# Patient Record
Sex: Female | Born: 1970 | Race: Black or African American | Hispanic: No | Marital: Single | Smoking: Never smoker
Health system: Southern US, Community
[De-identification: ages and names within clinical notes are randomized; demographics above are authoritative.]

## PROBLEM LIST (undated history)

## (undated) DIAGNOSIS — I10 Essential (primary) hypertension: Secondary | ICD-10-CM

## (undated) HISTORY — DX: Essential (primary) hypertension: I10

## (undated) HISTORY — PX: TUBAL LIGATION: SHX77

---

## 1998-06-17 ENCOUNTER — Ambulatory Visit (HOSPITAL_COMMUNITY): Admission: RE | Admit: 1998-06-17 | Discharge: 1998-06-17 | Payer: Self-pay | Admitting: Obstetrics

## 1998-08-27 ENCOUNTER — Inpatient Hospital Stay (HOSPITAL_COMMUNITY): Admission: RE | Admit: 1998-08-27 | Discharge: 1998-08-27 | Payer: Self-pay | Admitting: Obstetrics

## 1998-08-31 ENCOUNTER — Encounter (HOSPITAL_COMMUNITY): Admission: RE | Admit: 1998-08-31 | Discharge: 1998-11-13 | Payer: Self-pay | Admitting: Obstetrics & Gynecology

## 1998-09-05 ENCOUNTER — Inpatient Hospital Stay (HOSPITAL_COMMUNITY): Admission: AD | Admit: 1998-09-05 | Discharge: 1998-09-05 | Payer: Self-pay | Admitting: *Deleted

## 1998-09-07 ENCOUNTER — Inpatient Hospital Stay (HOSPITAL_COMMUNITY): Admission: AD | Admit: 1998-09-07 | Discharge: 1998-09-07 | Payer: Self-pay | Admitting: *Deleted

## 1998-09-10 ENCOUNTER — Encounter: Admission: RE | Admit: 1998-09-10 | Discharge: 1998-09-10 | Payer: Self-pay | Admitting: Obstetrics

## 1998-09-16 ENCOUNTER — Encounter: Admission: RE | Admit: 1998-09-16 | Discharge: 1998-09-16 | Payer: Self-pay | Admitting: Obstetrics & Gynecology

## 1998-09-30 ENCOUNTER — Encounter: Admission: RE | Admit: 1998-09-30 | Discharge: 1998-09-30 | Payer: Self-pay | Admitting: Obstetrics & Gynecology

## 1998-10-14 ENCOUNTER — Encounter: Admission: RE | Admit: 1998-10-14 | Discharge: 1998-10-14 | Payer: Self-pay | Admitting: Obstetrics & Gynecology

## 1998-10-21 ENCOUNTER — Encounter: Admission: RE | Admit: 1998-10-21 | Discharge: 1998-10-21 | Payer: Self-pay | Admitting: Obstetrics & Gynecology

## 1998-10-28 ENCOUNTER — Encounter: Admission: RE | Admit: 1998-10-28 | Discharge: 1998-10-28 | Payer: Self-pay | Admitting: Obstetrics & Gynecology

## 1998-11-04 ENCOUNTER — Encounter: Admission: RE | Admit: 1998-11-04 | Discharge: 1998-11-04 | Payer: Self-pay | Admitting: Obstetrics & Gynecology

## 1998-11-11 ENCOUNTER — Inpatient Hospital Stay (HOSPITAL_COMMUNITY): Admission: AD | Admit: 1998-11-11 | Discharge: 1998-11-14 | Payer: Self-pay | Admitting: Obstetrics

## 1998-11-11 ENCOUNTER — Encounter: Admission: RE | Admit: 1998-11-11 | Discharge: 1998-11-11 | Payer: Self-pay | Admitting: Obstetrics & Gynecology

## 2000-04-06 ENCOUNTER — Encounter: Payer: Self-pay | Admitting: Emergency Medicine

## 2000-04-06 ENCOUNTER — Emergency Department (HOSPITAL_COMMUNITY): Admission: EM | Admit: 2000-04-06 | Discharge: 2000-04-06 | Payer: Self-pay | Admitting: Emergency Medicine

## 2003-01-30 ENCOUNTER — Emergency Department (HOSPITAL_COMMUNITY): Admission: EM | Admit: 2003-01-30 | Discharge: 2003-01-30 | Payer: Self-pay | Admitting: Emergency Medicine

## 2003-11-17 ENCOUNTER — Emergency Department (HOSPITAL_COMMUNITY): Admission: EM | Admit: 2003-11-17 | Discharge: 2003-11-17 | Payer: Self-pay | Admitting: Emergency Medicine

## 2004-01-25 ENCOUNTER — Emergency Department (HOSPITAL_COMMUNITY): Admission: EM | Admit: 2004-01-25 | Discharge: 2004-01-25 | Payer: Self-pay | Admitting: Emergency Medicine

## 2005-02-11 ENCOUNTER — Emergency Department (HOSPITAL_COMMUNITY): Admission: EM | Admit: 2005-02-11 | Discharge: 2005-02-11 | Payer: Self-pay | Admitting: Family Medicine

## 2005-02-11 ENCOUNTER — Ambulatory Visit (HOSPITAL_COMMUNITY): Admission: RE | Admit: 2005-02-11 | Discharge: 2005-02-11 | Payer: Self-pay | Admitting: Family Medicine

## 2007-02-13 ENCOUNTER — Emergency Department (HOSPITAL_COMMUNITY): Admission: EM | Admit: 2007-02-13 | Discharge: 2007-02-13 | Payer: Self-pay | Admitting: Family Medicine

## 2007-04-27 ENCOUNTER — Emergency Department (HOSPITAL_COMMUNITY): Admission: EM | Admit: 2007-04-27 | Discharge: 2007-04-27 | Payer: Self-pay | Admitting: Emergency Medicine

## 2007-05-02 ENCOUNTER — Emergency Department (HOSPITAL_COMMUNITY): Admission: EM | Admit: 2007-05-02 | Discharge: 2007-05-02 | Payer: Self-pay | Admitting: Emergency Medicine

## 2007-07-01 ENCOUNTER — Emergency Department (HOSPITAL_COMMUNITY): Admission: EM | Admit: 2007-07-01 | Discharge: 2007-07-01 | Payer: Self-pay | Admitting: Emergency Medicine

## 2007-07-04 ENCOUNTER — Emergency Department (HOSPITAL_COMMUNITY): Admission: EM | Admit: 2007-07-04 | Discharge: 2007-07-04 | Payer: Self-pay | Admitting: Family Medicine

## 2007-07-27 ENCOUNTER — Emergency Department (HOSPITAL_COMMUNITY): Admission: EM | Admit: 2007-07-27 | Discharge: 2007-07-27 | Payer: Self-pay | Admitting: Emergency Medicine

## 2007-11-09 ENCOUNTER — Emergency Department (HOSPITAL_COMMUNITY): Admission: EM | Admit: 2007-11-09 | Discharge: 2007-11-09 | Payer: Self-pay | Admitting: Emergency Medicine

## 2007-11-29 ENCOUNTER — Encounter: Admission: RE | Admit: 2007-11-29 | Discharge: 2007-11-29 | Payer: Self-pay | Admitting: Family Medicine

## 2007-12-30 ENCOUNTER — Emergency Department (HOSPITAL_COMMUNITY): Admission: EM | Admit: 2007-12-30 | Discharge: 2007-12-30 | Payer: Self-pay | Admitting: Emergency Medicine

## 2008-07-09 ENCOUNTER — Emergency Department (HOSPITAL_COMMUNITY): Admission: EM | Admit: 2008-07-09 | Discharge: 2008-07-10 | Payer: Self-pay | Admitting: Emergency Medicine

## 2009-01-25 ENCOUNTER — Emergency Department (HOSPITAL_COMMUNITY): Admission: EM | Admit: 2009-01-25 | Discharge: 2009-01-25 | Payer: Self-pay | Admitting: Family Medicine

## 2009-09-04 ENCOUNTER — Emergency Department (HOSPITAL_COMMUNITY): Admission: EM | Admit: 2009-09-04 | Discharge: 2009-09-04 | Payer: Self-pay | Admitting: Family Medicine

## 2009-11-24 ENCOUNTER — Emergency Department (HOSPITAL_COMMUNITY): Admission: EM | Admit: 2009-11-24 | Discharge: 2009-11-24 | Payer: Self-pay | Admitting: Emergency Medicine

## 2010-11-21 ENCOUNTER — Encounter: Payer: Self-pay | Admitting: Family Medicine

## 2011-01-19 ENCOUNTER — Emergency Department (HOSPITAL_COMMUNITY)
Admission: EM | Admit: 2011-01-19 | Discharge: 2011-01-19 | Disposition: A | Discharge status: Home / Self Care | Payer: Self-pay | Attending: Emergency Medicine | Admitting: Emergency Medicine

## 2011-01-19 DIAGNOSIS — J3489 Other specified disorders of nose and nasal sinuses: Secondary | ICD-10-CM | POA: Insufficient documentation

## 2011-01-19 DIAGNOSIS — J069 Acute upper respiratory infection, unspecified: Secondary | ICD-10-CM | POA: Insufficient documentation

## 2011-01-19 DIAGNOSIS — R0982 Postnasal drip: Secondary | ICD-10-CM | POA: Insufficient documentation

## 2011-01-19 DIAGNOSIS — M542 Cervicalgia: Secondary | ICD-10-CM | POA: Insufficient documentation

## 2011-01-19 DIAGNOSIS — J029 Acute pharyngitis, unspecified: Secondary | ICD-10-CM | POA: Insufficient documentation

## 2011-01-19 DIAGNOSIS — R05 Cough: Secondary | ICD-10-CM | POA: Insufficient documentation

## 2011-01-19 DIAGNOSIS — R059 Cough, unspecified: Secondary | ICD-10-CM | POA: Insufficient documentation

## 2011-01-19 LAB — RAPID STREP SCREEN (MED CTR MEBANE ONLY): Streptococcus, Group A Screen (Direct): NEGATIVE

## 2011-02-02 LAB — POCT URINALYSIS DIP (DEVICE)
Glucose, UA: NEGATIVE mg/dL
Nitrite: NEGATIVE
Specific Gravity, Urine: 1.025 (ref 1.005–1.030)
pH: 5.5 (ref 5.0–8.0)

## 2011-02-02 LAB — POCT I-STAT, CHEM 8
Glucose, Bld: 83 mg/dL (ref 70–99)
HCT: 44 % (ref 36.0–46.0)
Sodium: 138 mEq/L (ref 135–145)

## 2011-07-25 LAB — POCT CARDIAC MARKERS
CKMB, poc: <1 — ABNORMAL LOW
CKMB, poc: <1 — ABNORMAL LOW
Myoglobin, poc: 60.5
Operator id: 284141
Troponin i, poc: <0.05

## 2011-07-25 LAB — I-STAT 8, (EC8 V) (CONVERTED LAB)
Bicarbonate: 23.2
Glucose, Bld: 87
Hemoglobin: 14.6
Potassium: 4.3
Sodium: 138
pCO2, Ven: 38.7 — ABNORMAL LOW

## 2011-07-25 LAB — URINALYSIS, ROUTINE W REFLEX MICROSCOPIC
Bilirubin Urine: NEGATIVE
Glucose, UA: NEGATIVE
Hgb urine dipstick: NEGATIVE
Ketones, ur: NEGATIVE
Nitrite: NEGATIVE
Protein, ur: NEGATIVE
Specific Gravity, Urine: 1.012
Urobilinogen, UA: 0.2
pH: 7

## 2011-07-25 LAB — PREGNANCY, URINE: Preg Test, Ur: NEGATIVE

## 2011-07-25 LAB — D-DIMER, QUANTITATIVE: D-Dimer, Quant: <0.22

## 2011-07-25 LAB — POCT I-STAT CREATININE: Operator id: 284141

## 2011-08-03 LAB — URINALYSIS, ROUTINE W REFLEX MICROSCOPIC
Glucose, UA: NEGATIVE
Hgb urine dipstick: NEGATIVE
Ketones, ur: NEGATIVE
Protein, ur: NEGATIVE
Urobilinogen, UA: 1

## 2011-08-03 LAB — POCT PREGNANCY, URINE: Preg Test, Ur: NEGATIVE

## 2011-08-11 LAB — POCT URINALYSIS DIP (DEVICE)
Bilirubin Urine: NEGATIVE
Ketones, ur: NEGATIVE
Operator id: 116391
Protein, ur: NEGATIVE
Specific Gravity, Urine: 1.02

## 2011-08-11 LAB — I-STAT 8, (EC8 V) (CONVERTED LAB)
BUN: 10
Bicarbonate: 25 — ABNORMAL HIGH
HCT: 45
Hemoglobin: 15.3 — ABNORMAL HIGH
Operator id: 126491
TCO2: 26
pCO2, Ven: 47.2

## 2011-08-11 LAB — POCT PREGNANCY, URINE: Operator id: 116391

## 2011-08-16 LAB — CULTURE, ROUTINE-ABSCESS

## 2011-08-17 LAB — CULTURE, ROUTINE-ABSCESS

## 2011-12-29 ENCOUNTER — Emergency Department (HOSPITAL_COMMUNITY)
Admission: EM | Admit: 2011-12-29 | Discharge: 2011-12-30 | Discharge status: Against Medical Advice | Payer: Self-pay | Attending: Emergency Medicine | Admitting: Emergency Medicine

## 2011-12-29 ENCOUNTER — Encounter (HOSPITAL_COMMUNITY): Payer: Self-pay | Admitting: Emergency Medicine

## 2011-12-29 DIAGNOSIS — R109 Unspecified abdominal pain: Secondary | ICD-10-CM | POA: Insufficient documentation

## 2011-12-29 LAB — URINALYSIS, ROUTINE W REFLEX MICROSCOPIC
Glucose, UA: NEGATIVE mg/dL
Ketones, ur: NEGATIVE mg/dL
Protein, ur: NEGATIVE mg/dL
Urobilinogen, UA: 1 mg/dL (ref 0.0–1.0)

## 2011-12-29 LAB — URINE MICROSCOPIC-ADD ON

## 2011-12-29 NOTE — ED Notes (Signed)
Pt c/o mid to right abd pain onset 3 days ago, worse today.  Denies nausea or vomiting

## 2011-12-29 NOTE — ED Notes (Signed)
Called x's 2 in main lobby and triage w/ no answer

## 2012-03-16 ENCOUNTER — Encounter (HOSPITAL_COMMUNITY): Payer: Self-pay | Admitting: Emergency Medicine

## 2012-03-16 ENCOUNTER — Emergency Department (HOSPITAL_COMMUNITY)
Admission: EM | Admit: 2012-03-16 | Discharge: 2012-03-16 | Disposition: A | Discharge status: Home / Self Care | Payer: Self-pay | Attending: Emergency Medicine | Admitting: Emergency Medicine

## 2012-03-16 DIAGNOSIS — R109 Unspecified abdominal pain: Secondary | ICD-10-CM | POA: Insufficient documentation

## 2012-03-16 DIAGNOSIS — R110 Nausea: Secondary | ICD-10-CM | POA: Insufficient documentation

## 2012-03-16 DIAGNOSIS — Z79899 Other long term (current) drug therapy: Secondary | ICD-10-CM | POA: Insufficient documentation

## 2012-03-16 LAB — COMPREHENSIVE METABOLIC PANEL
ALT: 10 U/L (ref 0–35)
AST: 18 U/L (ref 0–37)
CO2: 25 mEq/L (ref 19–32)
Chloride: 106 mEq/L (ref 96–112)
GFR calc non Af Amer: >90 mL/min (ref >90)
Sodium: 140 mEq/L (ref 135–145)
Total Bilirubin: 0.2 mg/dL — ABNORMAL LOW (ref 0.3–1.2)

## 2012-03-16 LAB — URINALYSIS, ROUTINE W REFLEX MICROSCOPIC
Hgb urine dipstick: NEGATIVE
Nitrite: NEGATIVE
Specific Gravity, Urine: 1.023 (ref 1.005–1.030)
Urobilinogen, UA: 1 mg/dL (ref 0.0–1.0)

## 2012-03-16 LAB — DIFFERENTIAL
Basophils Absolute: 0.1 10*3/uL (ref 0.0–0.1)
Lymphocytes Relative: 36 % (ref 12–46)
Neutro Abs: 5 10*3/uL (ref 1.7–7.7)

## 2012-03-16 LAB — CBC
Platelets: 292 10*3/uL (ref 150–400)
RDW: 16.6 % — ABNORMAL HIGH (ref 11.5–15.5)
WBC: 9.1 10*3/uL (ref 4.0–10.5)

## 2012-03-16 LAB — POCT PREGNANCY, URINE: Preg Test, Ur: NEGATIVE

## 2012-03-16 MED ORDER — ONDANSETRON 4 MG PO TBDP
8.0000 mg | ORAL_TABLET | Freq: Once | ORAL | Status: AC
  Administered 2012-03-16: 8 mg via ORAL
  Filled 2012-03-16: qty 2

## 2012-03-16 MED ORDER — OXYCODONE-ACETAMINOPHEN 5-325 MG PO TABS
1.0000 | ORAL_TABLET | Freq: Once | ORAL | Status: AC
  Administered 2012-03-16: 1 via ORAL
  Filled 2012-03-16: qty 1

## 2012-03-16 MED ORDER — FAMOTIDINE 40 MG PO TABS: 20.0000 mg | ORAL_TABLET | Freq: Every day | ORAL | Status: DC

## 2012-03-16 NOTE — ED Notes (Signed)
Pt reports having in R groin abd pain for 1-2 weeks intermittently, has been taking ibuprofen and Excedrin for pain; had nausea last night, denies dysuria

## 2012-03-16 NOTE — ED Notes (Signed)
Patient states for the last couple of weeks she had RUQ pain that comes and goes.  Has been taking Advil and Excedrin for the pain but it has not been helping today.  States she does not have any appetite  Also states that smells sometimes make her nauseated but has not vomited.  Denies any changes in her bowels, no urinary problems or vaginal discharge

## 2012-03-16 NOTE — ED Provider Notes (Signed)
History     CSN: [id number]  Arrival date & time 03/16/12  Jenna Weaver   First MD Initiated Contact with Patient 03/16/12 2124      Chief Complaint  Patient presents with  . Abdominal Pain     Patient is a 41 y.o. female presenting with abdominal pain. The history is provided by the patient.  Abdominal Pain The primary symptoms of the illness include abdominal pain and nausea. The primary symptoms of the illness do not include fever, fatigue, vomiting, diarrhea, dysuria, vaginal discharge or vaginal bleeding. The current episode started more than 2 days ago. The onset of the illness was gradual. The problem has been gradually worsening.  The patient states that she believes she is currently not pregnant. Symptoms associated with the illness do not include chills, urgency, frequency or back pain.  Patient reports abdominal pain intermittently for past two weeks Her pain is mostly in the RUQ, but at times it is diffuse and in lower abdomen No vomiting No association with food No h/o abdominal surgery The pain with improve with ibuprofen  PMh - none  Past Surgical History  Procedure Date  . Tubal ligation     History reviewed. No pertinent family history.  History  Substance Use Topics  . Smoking status: Never Smoker   . Smokeless tobacco: Not on file  . Alcohol Use: No    OB History    Grav Para Term Preterm Abortions TAB SAB Ect Mult Living                  Review of Systems  Constitutional: Negative for fever, chills and fatigue.  Respiratory: Negative for cough.   Gastrointestinal: Positive for nausea and abdominal pain. Negative for vomiting and diarrhea.  Genitourinary: Negative for dysuria, urgency, frequency, vaginal bleeding and vaginal discharge.  Musculoskeletal: Negative for back pain.  All other systems reviewed and are negative.    Allergies  Codeine  Home Medications   Current Outpatient Rx  Name Route Sig Dispense Refill  . NAPROXEN SODIUM 220 MG  PO TABS Oral Take 220-440 mg by mouth See admin instructions. Take 220mg -440mg  up to 5 times daily as needed for pain      BP 149/71  Pulse 63  Temp(Src) 98.3 F (36.8 C) (Oral)  Resp 20  SpO2 98%  LMP 02/11/2012  Physical Exam CONSTITUTIONAL: Well developed/well nourished HEAD AND FACE: Normocephalic/atraumatic EYES: EOMI/PERRL, no icterus ENMT: Mucous membranes moist NECK: supple no meningeal signs SPINE:entire spine nontender CV: S1/S2 noted, no murmurs/rubs/gallops noted LUNGS: Lungs are clear to auscultation bilaterally, no apparent distress ABDOMEN: soft, mild tenderness to RUQ/epigastric region, no rebound/guarding is noted GU:no cva tenderness NEURO: Pt is awake/alert, moves all extremitiesx4 EXTREMITIES: pulses normal, full ROM SKIN: warm, color normal PSYCH: no abnormalities of mood noted  ED Course  Procedures   Labs Reviewed  URINALYSIS, ROUTINE W REFLEX MICROSCOPIC - Abnormal; Notable for the following:    APPearance HAZY (*)    All other components within normal limits  CBC - Abnormal; Notable for the following:    Hemoglobin 10.4 (*)    HCT 31.7 (*)    MCV 72.5 (*)    MCH 23.8 (*)    RDW 16.6 (*)    All other components within normal limits  POCT PREGNANCY, URINE  DIFFERENTIAL  COMPREHENSIVE METABOLIC PANEL  LIPASE, BLOOD  10:39 PM Pt well appearing pain had improved before my examination, only mild tenderness on exam No GU complaints Will check labs and  reassess  11:12 PM Pt improved while monitored in the ED She now reports that her pain is sometimes associated with food Suspect gastritis vs stable cholelithiasis She is well appearing/nontoxic and appropriate for outpatient management We discussed strict return precautions   The patient appears reasonably screened and/or stabilized for discharge and I doubt any other medical condition or other [facility] requiring further screening, evaluation, or treatment in the ED at this time prior to  discharge.    MDM  Nursing notes reviewed and considered in documentation All labs/vitals reviewed and considered         Joya Gaskins, MD 03/16/12 2314

## 2012-03-16 NOTE — Discharge Instructions (Signed)

## 2012-06-09 ENCOUNTER — Encounter (HOSPITAL_COMMUNITY): Payer: Self-pay | Admitting: *Deleted

## 2012-06-09 ENCOUNTER — Emergency Department (HOSPITAL_COMMUNITY)
Admission: EM | Admit: 2012-06-09 | Discharge: 2012-06-09 | Disposition: A | Discharge status: Home / Self Care | Payer: Self-pay | Attending: Emergency Medicine | Admitting: Emergency Medicine

## 2012-06-09 DIAGNOSIS — M79609 Pain in unspecified limb: Secondary | ICD-10-CM | POA: Insufficient documentation

## 2012-06-09 DIAGNOSIS — M79605 Pain in left leg: Secondary | ICD-10-CM

## 2012-06-09 LAB — BASIC METABOLIC PANEL
CO2: 26 mEq/L (ref 19–32)
Calcium: 8.6 mg/dL (ref 8.4–10.5)
Creatinine, Ser: 0.63 mg/dL (ref 0.50–1.10)
Glucose, Bld: 87 mg/dL (ref 70–99)
Sodium: 139 mEq/L (ref 135–145)

## 2012-06-09 NOTE — ED Provider Notes (Signed)
History    This chart was scribed for Gerhard Munch, MD, MD by Smitty Pluck. The patient was seen in room [facility] and the patient's care was started at 12:51PM.   CSN: [id number]  Arrival date & time 06/09/12  1217   First MD Initiated Contact with Patient 06/09/12 1226      Chief Complaint  Patient presents with  . Leg Pain    (Consider location/radiation/quality/duration/timing/severity/associated sxs/prior treatment) Patient is a 41 y.o. female presenting with leg pain. The history is provided by the patient.  Leg Pain    Jenna Weaver is a 41 y.o. female who presents to the Emergency Department complaining of moderate left leg swelling and mild pain onset 1 day ago. Pt reports the entire left leg has swelling. Pt reports trouble walking due leg swelling. Denies new medication, trauma to left leg and new diet. Denies smoking and drinking. Denies any other pain.   History reviewed. No pertinent past medical history.  Past Surgical History  Procedure Date  . Tubal ligation     History reviewed. No pertinent family history. Family hx of diabetes and HTN.   History  Substance Use Topics  . Smoking status: Never Smoker   . Smokeless tobacco: Not on file  . Alcohol Use: No    OB History    Grav Para Term Preterm Abortions TAB SAB Ect Mult Living                  Review of Systems  Constitutional:       HPI  HENT:       HPI otherwise negative  Eyes: Negative.   Respiratory:       HPI, otherwise negative  Cardiovascular:       HPI, otherwise nmegative  Gastrointestinal: Negative for vomiting.  Genitourinary:       HPI, otherwise negative  Musculoskeletal: Positive for gait problem.       HPI, otherwise negative  Skin: Negative.   Neurological: Negative for syncope.    Allergies  Codeine  Home Medications   Current Outpatient Rx  Name Route Sig Dispense Refill  . FAMOTIDINE 40 MG PO TABS Oral Take 0.5 tablets (20 mg total) by mouth daily. 30 tablet  0  . NAPROXEN SODIUM 220 MG PO TABS Oral Take 220-440 mg by mouth See admin instructions. Take 220mg -440mg  up to 5 times daily as needed for pain      BP 152/78  Pulse 74  Temp 98.4 F (36.9 C) (Oral)  Resp 18  SpO2 98%  Physical Exam  Nursing note and vitals reviewed. Constitutional: She is oriented to person, place, and time. She appears well-developed and well-nourished.  HENT:  Head: Normocephalic and atraumatic.  Cardiovascular: Normal rate, regular rhythm and normal heart sounds.   Pulmonary/Chest: Effort normal. No respiratory distress.  Musculoskeletal:       Good distal pulses  Dimensions are the same bilaterally  No posterior findings    Neurological: She is alert and oriented to person, place, and time.  Skin: Skin is warm and dry.  Psychiatric: She has a normal mood and affect. Her behavior is normal.    ED Course  Procedures (including critical care time)   COORDINATION OF CARE: 12:56PM EDP discusses pt ED treatment with pt    Labs Reviewed  D-DIMER, QUANTITATIVE  BASIC METABOLIC PANEL   No results found.   No diagnosis found.    MDM  I personally performed the services described in this documentation, which  was scribed in my presence. The recorded information has been reviewed and considered.   This generally well-appearing female presents with concerns of the left leg pain and edema.  On exam the patient is in no distress, with no hypoxia, tachycardia, tachypnea.  The patient's legs are equal dimensions, is measured with tape.  There is mild tenderness to palpation about the left anterior mid thigh, suggestive of a musculoskeletal etiology.  Risk factors, the patient will risk for DVT.  A negative d-dimer reinforces this.  The patient's chemistry was performed to evaluate for a left leg disturbances, this was not demonstrated.  She was discharged in stable condition with return precautions, follow up instructions   Gerhard Munch, MD 06/09/12  [phone]

## 2012-06-09 NOTE — ED Notes (Signed)
Pt c/o left leg pain with some swelling x 3 days; pt denies obvious injury

## 2012-09-14 ENCOUNTER — Emergency Department (HOSPITAL_COMMUNITY)
Admission: EM | Admit: 2012-09-14 | Discharge: 2012-09-14 | Disposition: A | Discharge status: Home / Self Care | Payer: Self-pay | Attending: Emergency Medicine | Admitting: Emergency Medicine

## 2012-09-14 ENCOUNTER — Encounter (HOSPITAL_COMMUNITY): Payer: Self-pay | Admitting: *Deleted

## 2012-09-14 DIAGNOSIS — R1310 Dysphagia, unspecified: Secondary | ICD-10-CM | POA: Insufficient documentation

## 2012-09-14 DIAGNOSIS — J029 Acute pharyngitis, unspecified: Secondary | ICD-10-CM | POA: Insufficient documentation

## 2012-09-14 MED ORDER — PENICILLIN G BENZATHINE 1200000 UNIT/2ML IM SUSP
1.2000 10*6.[IU] | Freq: Once | INTRAMUSCULAR | Status: AC
  Administered 2012-09-14: 1.2 10*6.[IU] via INTRAMUSCULAR
  Filled 2012-09-14: qty 2

## 2012-09-14 MED ORDER — HYDROCODONE-ACETAMINOPHEN 7.5-325 MG/15ML PO SOLN: 15.0000 mL | Freq: Four times a day (QID) | ORAL | Status: DC | PRN

## 2012-09-14 NOTE — ED Notes (Signed)
Pt c/o a sore throat since yesterday no temp.  Difficulty swallowing

## 2012-09-14 NOTE — ED Provider Notes (Signed)
History   This chart was scribed for [employer]. Rubin Payor, MD, by Marcina Millard scribe. The patient was seen in room TR07C/TR07C and the patient's care was started at 1757.    CSN: [id number]  Arrival date & time 09/14/12  1749   First MD Initiated Contact with Patient 09/14/12 1757      Chief Complaint  Patient presents with  . Sore Throat    (Consider location/radiation/quality/duration/timing/severity/associated sxs/prior treatment) HPI Comments: Jenna Weaver is a 41 y.o. female who presents to the Emergency Department complaining of a constant, moderate sore throat that began yesterday. She reports associated pain and difficulty swallowing. She denies any fever. She denies any recent choking or injury to the area. She is allergic to codeine and is currently not pregnant.     History reviewed. No pertinent past medical history.  Past Surgical History  Procedure Date  . Tubal ligation     No family history on file.  History  Substance Use Topics  . Smoking status: Never Smoker   . Smokeless tobacco: Not on file  . Alcohol Use: No    OB History    Grav Para Term Preterm Abortions TAB SAB Ect Mult Living                  Review of Systems  Constitutional: Negative for fever.  HENT: Positive for sore throat and trouble swallowing.   All other systems reviewed and are negative.    Allergies  Codeine  Home Medications   Current Outpatient Rx  Name  Route  Sig  Dispense  Refill  . OVER THE COUNTER MEDICATION   Oral   Take 1 tablet by mouth every 8 (eight) hours as needed. OTC Cold tablets for cold symptoms         . HYDROCODONE-ACETAMINOPHEN 7.5-325 MG/15ML PO SOLN   Oral   Take 15 mLs by mouth every 6 (six) hours as needed for pain.   120 mL   0     BP 141/77  Pulse 76  Temp 98.2 F (36.8 C) (Oral)  Resp 16  SpO2 99%  Physical Exam  Nursing note and vitals reviewed. Constitutional: She is oriented to person, place, and time. She  appears well-developed and well-nourished. No distress.  HENT:  Head: Normocephalic and atraumatic.  Mouth/Throat: Oropharyngeal exudate and posterior oropharyngeal erythema present.       She has no sinus tenderness. She has erythema and bilateral exudates. She has no swelling or meningeal signs.  Eyes: EOM are normal. Pupils are equal, round, and reactive to light.  Neck: Normal range of motion. Neck supple. No tracheal deviation present.  Cardiovascular: Normal rate.   Pulmonary/Chest: Effort normal. No respiratory distress.  Abdominal: Soft. She exhibits no distension.  Musculoskeletal: Normal range of motion. She exhibits no edema.  Lymphadenopathy:    She has cervical adenopathy.  Neurological: She is alert and oriented to person, place, and time.  Skin: Skin is warm and dry.  Psychiatric: She has a normal mood and affect. Her behavior is normal.    ED Course  Procedures (including critical care time)  COORDINATION OF CARE:  18:02- Discussed planned course of treatment with the patient, including antibiotics,  who is agreeable at this time.    Labs Reviewed  RAPID STREP SCREEN   No results found.   1. Pharyngitis       MDM  Patient with sore throat. Has exudate. Will be treated with antibiotics and pain medicines. Patient was  given penicillin IM since she states she cannot take pills I personally performed the services described in this documentation, which was scribed in my presence. The recorded information has been reviewed and is accurate.          Juliet Rude. Rubin Payor, MD 09/14/12 2014

## 2012-09-14 NOTE — ED Notes (Signed)
Patient is alert and orientedx4.  Patient was explained discharge instructions and she had no questions.   

## 2012-09-19 ENCOUNTER — Emergency Department (HOSPITAL_COMMUNITY)
Admission: EM | Admit: 2012-09-19 | Discharge: 2012-09-19 | Disposition: A | Discharge status: Home / Self Care | Payer: Self-pay | Attending: Emergency Medicine | Admitting: Emergency Medicine

## 2012-09-19 ENCOUNTER — Encounter (HOSPITAL_COMMUNITY): Payer: Self-pay

## 2012-09-19 DIAGNOSIS — H5789 Other specified disorders of eye and adnexa: Secondary | ICD-10-CM | POA: Insufficient documentation

## 2012-09-19 DIAGNOSIS — H109 Unspecified conjunctivitis: Secondary | ICD-10-CM | POA: Insufficient documentation

## 2012-09-19 MED ORDER — GENTAMICIN SULFATE 0.3 % OP SOLN: 1.0000 [drp] | OPHTHALMIC | Status: DC

## 2012-09-19 NOTE — ED Provider Notes (Signed)
History     CSN: [id number]  Arrival date & time 09/19/12  4098   First MD Initiated Contact with Patient 09/19/12 0930      Chief Complaint  Patient presents with  . Conjunctivitis    Right eye is itching and draining.     (Consider location/radiation/quality/duration/timing/severity/associated sxs/prior treatment) HPI  Jenna Weaver is a 41 y.o. female complaining of left eye tearing and redness onset x24 hours ago. Pt denies change in vision, sick contacts, fever .  No past medical history on file.  Past Surgical History  Procedure Date  . Tubal ligation     No family history on file.  History  Substance Use Topics  . Smoking status: Never Smoker   . Smokeless tobacco: Not on file  . Alcohol Use: No    OB History    Grav Para Term Preterm Abortions TAB SAB Ect Mult Living                  Review of Systems  Constitutional: Negative for fever.  Eyes: Positive for discharge and redness. Negative for visual disturbance.  Respiratory: Negative for shortness of breath.   Cardiovascular: Negative for chest pain.  Gastrointestinal: Negative for nausea, vomiting, abdominal pain and diarrhea.  All other systems reviewed and are negative.    Allergies  Codeine  Home Medications   Current Outpatient Rx  Name  Route  Sig  Dispense  Refill  . HYDROCODONE-ACETAMINOPHEN 7.5-325 MG/15ML PO SOLN   Oral   Take 15 mLs by mouth every 6 (six) hours as needed for pain.   120 mL   0     BP 140/85  Pulse 85  Temp 97.6 F (36.4 C)  Resp 18  Ht 5' (1.524 m)  Wt 180 lb (81.647 kg)  BMI 35.15 kg/m2  SpO2 99%  Physical Exam  Nursing note and vitals reviewed. Constitutional: She is oriented to person, place, and time. She appears well-developed and well-nourished. No distress.  HENT:  Head: Normocephalic.  Eyes: EOM are normal. Pupils are equal, round, and reactive to light. Right eye exhibits no discharge and no exudate. Left eye exhibits no discharge and  no exudate. Right conjunctiva is injected. Left conjunctiva is injected. Right eye exhibits normal extraocular motion. Left eye exhibits normal extraocular motion.  Cardiovascular: Normal rate.   Pulmonary/Chest: Effort normal. No stridor.  Musculoskeletal: Normal range of motion.  Neurological: She is alert and oriented to person, place, and time.  Psychiatric: She has a normal mood and affect.    ED Course  Procedures (including critical care time)  Labs Reviewed - No data to display No results found.   1. Conjunctivitis       MDM    Pt verbalized understanding and agrees with care plan. Outpatient follow-up and return precautions given.    New Prescriptions   GENTAMICIN (GARAMYCIN) 0.3 % OPHTHALMIC SOLUTION    Place 1 drop into both eyes every 4 (four) hours.         Wynetta Emery, PA-C 09/20/12 1053

## 2012-09-26 NOTE — ED Provider Notes (Signed)
Medical screening examination/treatment/procedure(s) were performed by non-physician practitioner and as supervising physician I was immediately available for consultation/collaboration.   Carleene Cooper III, MD 09/26/12 2037

## 2012-11-14 ENCOUNTER — Encounter (HOSPITAL_COMMUNITY): Payer: Self-pay | Admitting: Family Medicine

## 2012-11-14 ENCOUNTER — Emergency Department (HOSPITAL_COMMUNITY): Payer: Self-pay

## 2012-11-14 ENCOUNTER — Emergency Department (HOSPITAL_COMMUNITY)
Admission: EM | Admit: 2012-11-14 | Discharge: 2012-11-14 | Disposition: A | Discharge status: Home / Self Care | Payer: Self-pay | Attending: Emergency Medicine | Admitting: Emergency Medicine

## 2012-11-14 DIAGNOSIS — R109 Unspecified abdominal pain: Secondary | ICD-10-CM | POA: Insufficient documentation

## 2012-11-14 LAB — COMPREHENSIVE METABOLIC PANEL
ALT: 9 U/L (ref 0–35)
AST: 15 U/L (ref 0–37)
Alkaline Phosphatase: 64 U/L (ref 39–117)
Calcium: 9 mg/dL (ref 8.4–10.5)
Glucose, Bld: 89 mg/dL (ref 70–99)
Potassium: 3.8 mEq/L (ref 3.5–5.1)
Sodium: 139 mEq/L (ref 135–145)
Total Protein: 7.4 g/dL (ref 6.0–8.3)

## 2012-11-14 LAB — URINALYSIS, ROUTINE W REFLEX MICROSCOPIC
Glucose, UA: NEGATIVE mg/dL
Ketones, ur: NEGATIVE mg/dL
Leukocytes, UA: NEGATIVE
Specific Gravity, Urine: 1.007 (ref 1.005–1.030)
pH: 6 (ref 5.0–8.0)

## 2012-11-14 LAB — CBC WITH DIFFERENTIAL/PLATELET
Basophils Absolute: 0.1 10*3/uL (ref 0.0–0.1)
Eosinophils Absolute: 0.1 10*3/uL (ref 0.0–0.7)
Eosinophils Relative: 1 % (ref 0–5)
Lymphocytes Relative: 25 % (ref 12–46)
Lymphs Abs: 2.1 10*3/uL (ref 0.7–4.0)
MCH: 22.6 pg — ABNORMAL LOW (ref 26.0–34.0)
MCV: 71.9 fL — ABNORMAL LOW (ref 78.0–100.0)
Neutrophils Relative %: 64 % (ref 43–77)
Platelets: 373 10*3/uL (ref 150–400)
RBC: 3.67 MIL/uL — ABNORMAL LOW (ref 3.87–5.11)
RDW: 17.2 % — ABNORMAL HIGH (ref 11.5–15.5)
WBC: 8.1 10*3/uL (ref 4.0–10.5)

## 2012-11-14 MED ORDER — IBUPROFEN 800 MG PO TABS
800.0000 mg | ORAL_TABLET | Freq: Once | ORAL | Status: AC
  Administered 2012-11-14: 800 mg via ORAL
  Filled 2012-11-14: qty 1

## 2012-11-14 MED ORDER — TAMSULOSIN HCL 0.4 MG PO CAPS: 0.4000 mg | ORAL_CAPSULE | Freq: Every day | ORAL | Status: DC

## 2012-11-14 MED ORDER — ONDANSETRON HCL 4 MG PO TABS: 4.0000 mg | ORAL_TABLET | Freq: Four times a day (QID) | ORAL | Status: DC

## 2012-11-14 MED ORDER — ACETAMINOPHEN 325 MG PO TABS
975.0000 mg | ORAL_TABLET | Freq: Once | ORAL | Status: AC
  Administered 2012-11-14: 975 mg via ORAL
  Filled 2012-11-14: qty 3

## 2012-11-14 MED ORDER — OXYCODONE-ACETAMINOPHEN 10-325 MG PO TABS: 1.0000 | ORAL_TABLET | ORAL | Status: DC | PRN

## 2012-11-14 NOTE — ED Notes (Signed)
Patient transported to CT 

## 2012-11-14 NOTE — ED Provider Notes (Signed)
History     CSN: [id number]  Arrival date & time 11/14/12  4098   First MD Initiated Contact with Patient 11/14/12 [phone]      Chief Complaint  Patient presents with  . Flank Pain    (Consider location/radiation/quality/duration/timing/severity/associated sxs/prior treatment) HPI  Jenna Weaver is a 42 y.o. female complaining of  1-2 weeks of right flank pain. Denies history of kidney stones, dysuria, fever, recent trauma, vaginal discharge.. Patient several episodes of vomiting last week it hours all she is taking by mouth is normally. She states the pain is controlled with Zantac and ibuprofen. It is rated at 7/10 at worst.   History reviewed. No pertinent past medical history.  Past Surgical History  Procedure Date  . Tubal ligation     History reviewed. No pertinent family history.  History  Substance Use Topics  . Smoking status: Never Smoker   . Smokeless tobacco: Not on file  . Alcohol Use: No    OB History    Grav Para Term Preterm Abortions TAB SAB Ect Mult Living                  Review of Systems  Constitutional: Negative for fever.  Respiratory: Negative for shortness of breath.   Cardiovascular: Negative for chest pain.  Gastrointestinal: Negative for nausea, vomiting, abdominal pain and diarrhea.  Genitourinary: Positive for flank pain. Negative for dysuria.  All other systems reviewed and are negative.    Allergies  Codeine  Home Medications   Current Outpatient Rx  Name  Route  Sig  Dispense  Refill  . GENTAMICIN SULFATE 0.3 % OP SOLN   Both Eyes   Place 1 drop into both eyes every 4 (four) hours.   5 mL   0   . HYDROCODONE-ACETAMINOPHEN 7.5-325 MG/15ML PO SOLN   Oral   Take 15 mLs by mouth every 6 (six) hours as needed for pain.   120 mL   0     BP 119/59  Pulse 73  Temp 97.3 F (36.3 C)  Resp 18  SpO2 97%  LMP 11/09/2012  Physical Exam  Nursing note and vitals reviewed. Constitutional: She is oriented to person,  place, and time. She appears well-developed and well-nourished. No distress.  HENT:  Head: Normocephalic and atraumatic.  Mouth/Throat: Oropharynx is clear and moist.  Eyes: Conjunctivae normal and EOM are normal. Pupils are equal, round, and reactive to light.  Neck: Normal range of motion.  Cardiovascular: Normal rate, regular rhythm and intact distal pulses.   Pulmonary/Chest: Effort normal and breath sounds normal. No stridor. No respiratory distress. She has no wheezes. She has no rales. She exhibits no tenderness.  Abdominal: Soft. Bowel sounds are normal. She exhibits no distension and no mass. There is no tenderness. There is no rebound and no guarding.  Genitourinary:       No CVA tenderness bilaterally.  Musculoskeletal: Normal range of motion.  Neurological: She is alert and oriented to person, place, and time.  Psychiatric: She has a normal mood and affect.    ED Course  Procedures (including critical care time)  Labs Reviewed  CBC WITH DIFFERENTIAL - Abnormal; Notable for the following:    RBC 3.67 (*)     Hemoglobin 8.3 (*)     HCT 26.4 (*)     MCV 71.9 (*)     MCH 22.6 (*)     RDW 17.2 (*)     All other components within normal limits  COMPREHENSIVE METABOLIC PANEL - Abnormal; Notable for the following:    Albumin 3.3 (*)     Total Bilirubin 0.2 (*)     All other components within normal limits  URINALYSIS, ROUTINE W REFLEX MICROSCOPIC  POCT PREGNANCY, URINE   Ct Abdomen Pelvis Wo Contrast  11/14/2012  *RADIOLOGY REPORT*  Clinical Data: Flank pain  CT ABDOMEN AND PELVIS WITHOUT CONTRAST  Technique:  Multidetector CT imaging of the abdomen and pelvis was performed following the standard protocol without intravenous contrast.  Comparison: 07/09/2008  Findings: Lung bases: The lung bases appear clear.No pericardial or pleural effusion.  Kidneys/Ureters/Bladder:   The adrenal glands are both unremarkable.  The right kidney is normal.  No right hydronephrosis or  hydroureter.  Normal appearance of the left kidney.  Low attenuation structure within the mid pole of the left kidney measures 11 mm, image 29.  No left-sided hydronephrosis or hydroureter.  No nephrolithiasis.  The urinary bladder appears normal.  Enlarged fibroid uterus is partially calcified.  This measures 9.5 x 9.4 by 10.0 cm.  Other:   Focal area of low attenuation within the anterior right hepatic lobe measures 9 mm.  Previously this measured 6 mm.  No additional focal liver abnormalities.  The gallbladder is normal. No biliary dilatation.  Normal appearance of the pancreas.  The spleen is normal.  The abdominal aorta has a normal caliber.  No aneurysm.  There are no enlarged lymph nodes identified within the upper abdomen or the pelvis.  No inguinal adenopathy.  No free fluid or abnormal fluid collection identified.  The stomach appears normal.  The small bowel loops are unremarkable.  The appendix is visualized and appears normal. Normal appearance of the colon.  Bones/Musculoskeletal: No abnormality identified.  IMPRESSION:  1.  No evidence for nephrolithiasis or obstructive uropathy. 2.  11 mm hypodensity within the left kidney is incompletely characterized without IV contrast material. 3.  9 mm hypodensity in the anterior right hepatic lobe is too small to reliably characterize but has increased from 6 mm (07/09/2008). 4.  Partially calcified fibroid uterus.   Original Report Authenticated By: Signa Kell, M.D.    US Renal  11/14/2012  *RADIOLOGY REPORT*  Clinical Data: Flank pain  RENAL/URINARY TRACT ULTRASOUND COMPLETE  Comparison:  Prior CT scan of the abdomen 07/09/2008  Findings:  Right Kidney:  Within normal limits for size at 11.7 cm.  Normal parenchymal echogenicity and renal contour.  There is a small 11 x 8 by 8 mm minimally complex anechoic lesion with a single thin internal septation in the interpolar kidney most consistent with a minimally complex renal cyst.  There is mild  hydronephrosis.  Left Kidney:  Within normal limits for size (11.9 cm), contour and parenchymal echogenicity.  No hydronephrosis.  No focal solid lesion.  Bladder:  The bladder is distended with urine.  A left ureteral jet is identified.  IMPRESSION:  1.  Mild right hydronephrosis.  No right ureteral jet is identified suggesting at least partial obstruction.  2.  Minimally complex right renal cyst.   Original Report Authenticated By: Malachy Moan, M.D.      1. Flank pain       MDM  Pain is well-controlled with Motrin and acetaminophen.  Renal US shows right hydro, right renal cyst. CT shows no hydronephrosis or stone. Advised patient to try to establish primary care with follow back with the ED as needed for worsening or concerning symptoms.  New Prescriptions   OXYCODONE-ACETAMINOPHEN (PERCOCET) 10-325 MG  PER TABLET    Take 1 tablet by mouth every 4 (four) hours as needed for pain.         Wynetta Emery, PA-C 11/14/12 1626

## 2012-11-14 NOTE — ED Notes (Signed)
Per pt 2 weeks of right sided flank pain radiating into abdomen. Pain is intermittent. sts she vomited last week. Denies urinary symptoms. Denies vaginal symptoms.

## 2012-11-15 NOTE — ED Provider Notes (Signed)
Medical screening examination/treatment/procedure(s) were performed by non-physician practitioner and as supervising physician I was immediately available for consultation/collaboration.   Suzi Roots, MD 11/15/12 [phone]

## 2012-12-27 ENCOUNTER — Encounter (HOSPITAL_COMMUNITY): Payer: Self-pay | Admitting: Emergency Medicine

## 2012-12-27 ENCOUNTER — Emergency Department (HOSPITAL_COMMUNITY)
Admission: EM | Admit: 2012-12-27 | Discharge: 2012-12-27 | Disposition: A | Discharge status: Home / Self Care | Payer: Self-pay | Attending: Emergency Medicine | Admitting: Emergency Medicine

## 2012-12-27 DIAGNOSIS — L299 Pruritus, unspecified: Secondary | ICD-10-CM | POA: Insufficient documentation

## 2012-12-27 MED ORDER — DEXAMETHASONE SODIUM PHOSPHATE 10 MG/ML IJ SOLN
10.0000 mg | Freq: Once | INTRAMUSCULAR | Status: AC
  Administered 2012-12-27: 10 mg via INTRAMUSCULAR
  Filled 2012-12-27: qty 1

## 2012-12-27 MED ORDER — CARRINGTON MOISTURE BARRIER EX CREA: TOPICAL_CREAM | CUTANEOUS | Status: DC | PRN

## 2012-12-27 MED ORDER — ACYCLOVIR 400 MG PO TABS: 800.0000 mg | ORAL_TABLET | Freq: Every day | ORAL | Status: DC

## 2012-12-27 NOTE — ED Provider Notes (Signed)
History     CSN: [id number]  Arrival date & time 12/27/12  1316   First MD Initiated Contact with Patient 12/27/12 1334      No chief complaint on file.   (Consider location/radiation/quality/duration/timing/severity/associated sxs/prior treatment) HPI Comments: Cyst 42 year old female, no past medical history, who presents emergency department with chief complaint of. [place]. Patient states that she has been itching since last week. She states that she itches everywhere. She states that sometimes she can see small red bumps, but are not visible now. She denies any new soaps or detergents. She denies fever, any open wounds, or other symptoms. Additionally, the patient reports burning over the posterior aspect of the left arm, however there is no redness, or rash. Patient has past medical history her markable for chickenpox. She is tried taking Benadryl with no relief.  The history is provided by the patient. No language interpreter was used.    History reviewed. No pertinent past medical history.  Past Surgical History  Procedure Laterality Date  . Tubal ligation      No family history on file.  History  Substance Use Topics  . Smoking status: Never Smoker   . Smokeless tobacco: Not on file  . Alcohol Use: No    OB History   Grav Para Term Preterm Abortions TAB SAB Ect Mult Living                  Review of Systems  All other systems reviewed and are negative.    Allergies  Codeine  Home Medications   Current Outpatient Rx  Name  Route  Sig  Dispense  Refill  . diphenhydrAMINE (BENADRYL) 25 MG tablet   Oral   Take 50 mg by mouth every 6 (six) hours as needed for itching.         Marland Kitchen acyclovir (ZOVIRAX) 400 MG tablet   Oral   Take 2 tablets (800 mg total) by mouth 5 (five) times daily.   50 tablet   0   . Skin Protectants, Misc. (EUCERIN) cream   Topical   Apply topically as needed for wound care.   397 g   0     BP 132/79  Pulse 80  Temp(Src) 98 F  (36.7 C) (Oral)  Resp 16  SpO2 100%  Physical Exam  Nursing note and vitals reviewed. Constitutional: She is oriented to person, place, and time. She appears well-developed and well-nourished.  HENT:  Head: Normocephalic and atraumatic.  Eyes: Conjunctivae and EOM are normal. Pupils are equal, round, and reactive to light.  Neck: Normal range of motion. Neck supple.  Cardiovascular: Normal rate and regular rhythm.  Exam reveals no gallop and no friction rub.   No murmur heard. Pulmonary/Chest: Effort normal and breath sounds normal. No respiratory distress. She has no wheezes. She has no rales. She exhibits no tenderness.  Abdominal: Soft. Bowel sounds are normal. She exhibits no distension and no mass. There is no tenderness. There is no rebound and no guarding.  Musculoskeletal: Normal range of motion. She exhibits no edema and no tenderness.  Neurological: She is alert and oriented to person, place, and time.  Skin: Skin is warm and dry.  No rashes, lesions, or redness noted, the skin does appear to be diffusely dry  Psychiatric: She has a normal mood and affect. Her behavior is normal. Judgment and thought content normal.    ED Course  Procedures (including critical care time)  Labs Reviewed - No data to display  No results found.   1. Itching       MDM  42 year old female with periods. I suspect the patient's symptoms are stemming from dry skin. Her skin appears dry on exam. She does say that she uses cocoa butter lotion. Additionally she complains of some burning on the posterior side of the left arm, which is distributed in a single dermatome. I do not see any evidence of a rash or lesions, so I doubt that this is a shingles infection. However, I will give the patient a prescription for acyclovir, which I told her to only fill and take if she noticed a rash developing in the area where she is burning. Otherwise I recommended a new lotion for her to use today with the dry  skin, and will recommend dermatologic followup. Will treat the patient with a shot of Decadron in the emergency department to help alleviate the itching. She is tried taking Benadryl with no relief.       Roxy Horseman, PA-C 12/27/12 1413

## 2012-12-27 NOTE — ED Notes (Signed)
Itching everywhere since last week  And back of left arm hurting  No injury  Since last week also

## 2012-12-28 NOTE — ED Provider Notes (Signed)
Medical screening examination/treatment/procedure(s) were performed by non-physician practitioner and as supervising physician I was immediately available for consultation/collaboration.    Vida Roller, MD 12/28/12 [phone]

## 2013-03-23 ENCOUNTER — Emergency Department (HOSPITAL_COMMUNITY)
Admission: EM | Admit: 2013-03-23 | Discharge: 2013-03-23 | Disposition: A | Discharge status: Home / Self Care | Payer: Self-pay | Attending: Emergency Medicine | Admitting: Emergency Medicine

## 2013-03-23 ENCOUNTER — Encounter (HOSPITAL_COMMUNITY): Payer: Self-pay | Admitting: *Deleted

## 2013-03-23 DIAGNOSIS — N760 Acute vaginitis: Secondary | ICD-10-CM | POA: Insufficient documentation

## 2013-03-23 DIAGNOSIS — R300 Dysuria: Secondary | ICD-10-CM | POA: Insufficient documentation

## 2013-03-23 DIAGNOSIS — Z9851 Tubal ligation status: Secondary | ICD-10-CM | POA: Insufficient documentation

## 2013-03-23 DIAGNOSIS — Z3202 Encounter for pregnancy test, result negative: Secondary | ICD-10-CM | POA: Insufficient documentation

## 2013-03-23 DIAGNOSIS — N72 Inflammatory disease of cervix uteri: Secondary | ICD-10-CM | POA: Insufficient documentation

## 2013-03-23 DIAGNOSIS — Z792 Long term (current) use of antibiotics: Secondary | ICD-10-CM | POA: Insufficient documentation

## 2013-03-23 DIAGNOSIS — B9689 Other specified bacterial agents as the cause of diseases classified elsewhere: Secondary | ICD-10-CM

## 2013-03-23 LAB — URINALYSIS, ROUTINE W REFLEX MICROSCOPIC
Glucose, UA: NEGATIVE mg/dL
Ketones, ur: NEGATIVE mg/dL
Protein, ur: 30 mg/dL — AB
pH: 6 (ref 5.0–8.0)

## 2013-03-23 LAB — HIV ANTIBODY (ROUTINE TESTING W REFLEX): HIV: NONREACTIVE

## 2013-03-23 LAB — WET PREP, GENITAL
Trich, Wet Prep: NONE SEEN
Yeast Wet Prep HPF POC: NONE SEEN

## 2013-03-23 LAB — URINE MICROSCOPIC-ADD ON

## 2013-03-23 MED ORDER — METRONIDAZOLE 500 MG PO TABS: 500.0000 mg | ORAL_TABLET | Freq: Two times a day (BID) | ORAL | Status: DC

## 2013-03-23 MED ORDER — AZITHROMYCIN 250 MG PO TABS
1000.0000 mg | ORAL_TABLET | Freq: Once | ORAL | Status: AC
  Administered 2013-03-23: 1000 mg via ORAL
  Filled 2013-03-23: qty 4

## 2013-03-23 MED ORDER — CEFTRIAXONE SODIUM 250 MG IJ SOLR
250.0000 mg | Freq: Once | INTRAMUSCULAR | Status: AC
  Administered 2013-03-23: 250 mg via INTRAMUSCULAR
  Filled 2013-03-23: qty 250

## 2013-03-23 MED ORDER — LIDOCAINE HCL (PF) 1 % IJ SOLN
INTRAMUSCULAR | Status: AC
  Administered 2013-03-23: 2 mL via INTRAMUSCULAR
  Filled 2013-03-23: qty 5

## 2013-03-23 NOTE — ED Notes (Signed)
Patient C/O having a vaginal discharge that began yesterday.  States that she is having some itching. Denies and bloody discharge but does C/O some burning on urination.

## 2013-03-23 NOTE — ED Notes (Signed)
Pt reports vaginal itching and discharge that started over past several days, wants checked for std.

## 2013-03-23 NOTE — ED Provider Notes (Signed)
I saw and evaluated the patient, reviewed the resident's note and I agree with the findings and plan.   .Face to face Exam:  General:  Awake HEENT:  Atraumatic Resp:  Normal effort Abd:  Nondistended Neuro:No focal weakness Lymph: No adenopathy   Nelia Shi, MD 03/23/13 1407

## 2013-03-23 NOTE — ED Provider Notes (Signed)
History    CSN: [id number] Arrival date & time 03/23/13  1110  First MD Initiated Contact with Patient 03/23/13 1146    Chief Complaint  Patient presents with  . Vaginal Discharge    HPI Comments: Patient is a 42 year old female presenting with complaints of vaginal discharge.  She reports that over the past week she's had increasing dysuria, burning with urination, vaginal discharge to the point where she is soaking her clothes.  She does report being on her period last week but this stopped 5 days ago.  She is concerned she may have a sexually transmitted infection.  She's been with the same partner for 8 years.  They do have unprotected intercourse.   No history of STI.  Does have a history of yeast infections.  It seems different than that.  She does have a history of tubal ligation.  Denies any abdominal pain, no fevers, no nausea, no vomiting, no chills.    Patient is a 42 y.o. female presenting with vaginal discharge.  Vaginal Discharge   History reviewed. No pertinent past medical history.  Past Surgical History  Procedure Laterality Date  . Tubal ligation      History reviewed. No pertinent family history.  History  Substance Use Topics  . Smoking status: Never Smoker   . Smokeless tobacco: Not on file  . Alcohol Use: No    OB History   Grav Para Term Preterm Abortions TAB SAB Ect Mult Living                  Review of Systems  Genitourinary: Positive for vaginal discharge.    Allergies  Codeine  Home Medications   Current Outpatient Rx  Name  Route  Sig  Dispense  Refill  . metroNIDAZOLE (FLAGYL) 500 MG tablet   Oral   Take 1 tablet (500 mg total) by mouth 2 (two) times daily.   14 tablet   0     BP 132/75  Temp(Src) 98.4 F (36.9 C) (Oral)  Resp 18  Ht 5' (1.524 m)  Wt 130 lb (58.968 kg)  BMI 25.39 kg/m2  SpO2 97%  LMP 03/17/2013  Physical Exam  Vitals reviewed. Constitutional: She appears well-developed and well-nourished. No distress.   HENT:  Head: Normocephalic and atraumatic.  Eyes: Conjunctivae are normal. Right eye exhibits no discharge. Left eye exhibits no discharge. No scleral icterus.  Neck: No JVD present. No tracheal deviation present.  Cardiovascular: Normal rate and regular rhythm.   Pulmonary/Chest: Effort normal. No respiratory distress.  Abdominal: Soft. She exhibits no distension and no mass. There is tenderness (with pelvic exam and bilateral adnexa). There is no rebound and no guarding.  Genitourinary: There is no rash, tenderness, lesion or injury on the right labia. There is no rash, tenderness, lesion or injury on the left labia. Uterus is not deviated and not enlarged. Cervix exhibits motion tenderness, discharge and friability. Right adnexum displays tenderness. Right adnexum displays no mass and no fullness. Left adnexum displays tenderness. Left adnexum displays no mass and no fullness. No erythema, tenderness or bleeding around the vagina. No foreign body around the vagina. No signs of injury around the vagina. No vaginal discharge found.  Musculoskeletal: Normal range of motion. She exhibits no edema.  Neurological: She is alert. She exhibits normal muscle tone.  Skin: Skin is warm and dry. No rash noted. She is not diaphoretic. No erythema. No pallor.  Psychiatric: She has a normal mood and affect. Her behavior is normal.  Judgment and thought content normal.    ED Course  Procedures (including critical care time)  Labs Reviewed  WET PREP, GENITAL - Abnormal; Notable for the following:    Clue Cells Wet Prep HPF POC MANY (*)    WBC, Wet Prep HPF POC MODERATE (*)    All other components within normal limits  URINALYSIS, ROUTINE W REFLEX MICROSCOPIC - Abnormal; Notable for the following:    APPearance CLOUDY (*)    Hgb urine dipstick TRACE (*)    Bilirubin Urine SMALL (*)    Protein, ur 30 (*)    Leukocytes, UA TRACE (*)    All other components within normal limits  URINE MICROSCOPIC-ADD ON -  Abnormal; Notable for the following:    Squamous Epithelial / LPF MANY (*)    Bacteria, UA FEW (*)    All other components within normal limits  GC/CHLAMYDIA PROBE AMP  URINE CULTURE  PREGNANCY, URINE  HIV ANTIBODY (ROUTINE TESTING)  RPR   No results found.   1. Cervicitis   2. BV (bacterial vaginosis)       MDM  Patient with symptoms and exam consistent with cervicitis.  We'll plan to treat with IM Rocephin and azithromycin.  Discussed having partner tested/treated and remaining abstinent until 10 days post treatment for both partners.  Other BP present on exam will cut and treat as above.  Still encouraged to follow up with health Department for partner.  Discussed differences between between BV and STI and could not definitively rule out STI today.  Pt elected to be treated empirically.       Andrena Mews, DO 03/23/13 1402

## 2013-03-24 LAB — URINE CULTURE

## 2013-03-24 LAB — GC/CHLAMYDIA PROBE AMP: GC Probe RNA: NEGATIVE

## 2013-07-11 ENCOUNTER — Emergency Department (HOSPITAL_COMMUNITY)
Admission: EM | Admit: 2013-07-11 | Discharge: 2013-07-11 | Disposition: A | Discharge status: Home / Self Care | Payer: Self-pay | Attending: Emergency Medicine | Admitting: Emergency Medicine

## 2013-07-11 ENCOUNTER — Encounter (HOSPITAL_COMMUNITY): Payer: Self-pay | Admitting: Emergency Medicine

## 2013-07-11 DIAGNOSIS — Z3202 Encounter for pregnancy test, result negative: Secondary | ICD-10-CM | POA: Insufficient documentation

## 2013-07-11 DIAGNOSIS — K2970 Gastritis, unspecified, without bleeding: Secondary | ICD-10-CM | POA: Insufficient documentation

## 2013-07-11 LAB — URINALYSIS, ROUTINE W REFLEX MICROSCOPIC
Leukocytes, UA: NEGATIVE
Nitrite: NEGATIVE
Specific Gravity, Urine: 1.005 (ref 1.005–1.030)
pH: 7 (ref 5.0–8.0)

## 2013-07-11 LAB — COMPREHENSIVE METABOLIC PANEL
Albumin: 3.3 g/dL — ABNORMAL LOW (ref 3.5–5.2)
BUN: 13 mg/dL (ref 6–23)
Creatinine, Ser: 0.7 mg/dL (ref 0.50–1.10)
Potassium: 4.1 mEq/L (ref 3.5–5.1)
Total Protein: 7.7 g/dL (ref 6.0–8.3)

## 2013-07-11 LAB — CBC WITH DIFFERENTIAL/PLATELET
Basophils Absolute: 0.1 10*3/uL (ref 0.0–0.1)
Eosinophils Absolute: 0.1 10*3/uL (ref 0.0–0.7)
HCT: 29.7 % — ABNORMAL LOW (ref 36.0–46.0)
Lymphocytes Relative: 29 % (ref 12–46)
Lymphs Abs: 2.2 10*3/uL (ref 0.7–4.0)
MCHC: 31 g/dL (ref 30.0–36.0)
Neutro Abs: 4.5 10*3/uL (ref 1.7–7.7)
Platelets: 356 10*3/uL (ref 150–400)
RDW: 19.4 % — ABNORMAL HIGH (ref 11.5–15.5)

## 2013-07-11 LAB — URINE MICROSCOPIC-ADD ON

## 2013-07-11 LAB — LIPASE, BLOOD: Lipase: 32 U/L (ref 11–59)

## 2013-07-11 MED ORDER — GI COCKTAIL ~~LOC~~: 30.0000 mL | Freq: Three times a day (TID) | ORAL | Status: DC

## 2013-07-11 MED ORDER — PANTOPRAZOLE SODIUM 40 MG IV SOLR
40.0000 mg | Freq: Once | INTRAVENOUS | Status: AC
  Administered 2013-07-11: 40 mg via INTRAVENOUS
  Filled 2013-07-11: qty 40

## 2013-07-11 MED ORDER — RANITIDINE HCL 150 MG PO TABS: 150.0000 mg | ORAL_TABLET | Freq: Two times a day (BID) | ORAL | Status: DC

## 2013-07-11 MED ORDER — GI COCKTAIL ~~LOC~~
30.0000 mL | Freq: Once | ORAL | Status: AC
  Administered 2013-07-11: 30 mL via ORAL
  Filled 2013-07-11: qty 30

## 2013-07-11 NOTE — ED Provider Notes (Signed)
CSN: [id number]     Arrival date & time 07/11/13  0720 History   First MD Initiated Contact with Patient 07/11/13 [phone]     Chief Complaint  Patient presents with  . Abdominal Pain   (Consider location/radiation/quality/duration/timing/severity/associated sxs/prior Treatment) HPI Comments: Patient here with epigastric pain for the past 2 weeks - she states that when this started she began to take Zantac which then helped the pain - she reports that the pain returned last night at 0200 and awoke her from sleep - that the pain was the worst she had ever had.  She tried taking the Zantac for this but it did not help.  She denies nausea, vomiting, decrease in appetitie, lower abdominal pain, radiation of the pain, fever, chills, constipation or diarrhea.  She further denies urinary symptoms or vaginal discharge or bleeding.  Patient is a 42 y.o. female presenting with abdominal pain. The history is provided by the patient. No language interpreter was used.  Abdominal Pain Pain location:  Epigastric Pain quality: bloating and burning   Pain radiates to:  Does not radiate Pain severity:  Moderate Onset quality:  Sudden Timing:  Intermittent Progression:  Worsening Chronicity:  New Context: not alcohol use, not diet changes, not eating, not laxative use, not medication withdrawal, not previous surgeries, not recent travel, not retching, not sick contacts, not suspicious food intake and not trauma   Relieved by:  Antacids Worsened by:  Nothing tried Ineffective treatments:  Antacids Associated symptoms: no anorexia, no belching, no chest pain, no chills, no constipation, no cough, no diarrhea, no dysuria, no fatigue, no fever, no flatus, no hematemesis, no hematochezia, no hematuria, no melena, no nausea, no shortness of breath, no sore throat, no vaginal bleeding, no vaginal discharge and no vomiting     History reviewed. No pertinent past medical history. Past Surgical History  Procedure  Laterality Date  . Tubal ligation     No family history on file. History  Substance Use Topics  . Smoking status: Never Smoker   . Smokeless tobacco: Not on file  . Alcohol Use: No   OB History   Grav Para Term Preterm Abortions TAB SAB Ect Mult Living                 Review of Systems  Constitutional: Negative for fever, chills and fatigue.  HENT: Negative for sore throat.   Respiratory: Negative for cough and shortness of breath.   Cardiovascular: Negative for chest pain.  Gastrointestinal: Positive for abdominal pain. Negative for nausea, vomiting, diarrhea, constipation, melena, hematochezia, anorexia, flatus and hematemesis.  Genitourinary: Negative for dysuria, hematuria, vaginal bleeding and vaginal discharge.  All other systems reviewed and are negative.    Allergies  Codeine  Home Medications   Current Outpatient Rx  Name  Route  Sig  Dispense  Refill  . ranitidine (ZANTAC) 150 MG tablet   Oral   Take 150 mg by mouth daily as needed for heartburn (stomach pain).          BP 125/89  Pulse 60  Temp(Src) 98.3 F (36.8 C) (Oral)  Resp 18  Ht 5' (1.524 m)  Wt 140 lb (63.504 kg)  BMI 27.34 kg/m2  SpO2 100% Physical Exam  Nursing note and vitals reviewed. Constitutional: She is oriented to person, place, and time. She appears well-developed and well-nourished. No distress.  HENT:  Head: Normocephalic and atraumatic.  Right Ear: External ear normal.  Left Ear: External ear normal.  Nose: Nose normal.  Mouth/Throat: Oropharynx is clear and moist. No oropharyngeal exudate.  Eyes: Conjunctivae are normal. Pupils are equal, round, and reactive to light. No scleral icterus.  Neck: Normal range of motion. Neck supple.  Cardiovascular: Normal rate, regular rhythm and normal heart sounds.  Exam reveals no gallop and no friction rub.   No murmur heard. Pulmonary/Chest: Breath sounds normal. No respiratory distress. She has no wheezes. She has no rales. She  exhibits no tenderness.  Abdominal: Soft. Bowel sounds are normal. She exhibits no distension and no mass. There is tenderness in the epigastric area. There is no rebound and no guarding.    Musculoskeletal: Normal range of motion. She exhibits no edema and no tenderness.  Lymphadenopathy:    She has no cervical adenopathy.  Neurological: She is alert and oriented to person, place, and time. No cranial nerve deficit.  Skin: Skin is warm and dry. No rash noted. No erythema. No pallor.  Psychiatric: She has a normal mood and affect. Her behavior is normal. Judgment and thought content normal.    ED Course  Procedures (including critical care time) Labs Review Labs Reviewed  CBC WITH DIFFERENTIAL - Abnormal; Notable for the following:    Hemoglobin 9.2 (*)    HCT 29.7 (*)    MCV 66.0 (*)    MCH 20.4 (*)    RDW 19.4 (*)    All other components within normal limits  COMPREHENSIVE METABOLIC PANEL - Abnormal; Notable for the following:    Albumin 3.3 (*)    Total Bilirubin 0.1 (*)    All other components within normal limits  URINALYSIS, ROUTINE W REFLEX MICROSCOPIC - Abnormal; Notable for the following:    Hgb urine dipstick TRACE (*)    All other components within normal limits  LIPASE, BLOOD  URINE MICROSCOPIC-ADD ON  POCT PREGNANCY, URINE    Results for orders placed during the hospital encounter of 07/11/13  CBC WITH DIFFERENTIAL      Result Value Range   WBC 7.5  4.0 - 10.5 K/uL   RBC 4.50  3.87 - 5.11 MIL/uL   Hemoglobin 9.2 (*) 12.0 - 15.0 g/dL   HCT 16.1 (*) 09.6 - 04.5 %   MCV 66.0 (*) 78.0 - 100.0 fL   MCH 20.4 (*) 26.0 - 34.0 pg   MCHC 31.0  30.0 - 36.0 g/dL   RDW 40.9 (*) 81.1 - 91.4 %   Platelets 356  150 - 400 K/uL   Neutrophils Relative % 61  43 - 77 %   Lymphocytes Relative 29  12 - 46 %   Monocytes Relative 8  3 - 12 %   Eosinophils Relative 1  0 - 5 %   Basophils Relative 1  0 - 1 %   Neutro Abs 4.5  1.7 - 7.7 K/uL   Lymphs Abs 2.2  0.7 - 4.0 K/uL    Monocytes Absolute 0.6  0.1 - 1.0 K/uL   Eosinophils Absolute 0.1  0.0 - 0.7 K/uL   Basophils Absolute 0.1  0.0 - 0.1 K/uL   RBC Morphology POLYCHROMASIA PRESENT    COMPREHENSIVE METABOLIC PANEL      Result Value Range   Sodium 137  135 - 145 mEq/L   Potassium 4.1  3.5 - 5.1 mEq/L   Chloride 104  96 - 112 mEq/L   CO2 22  19 - 32 mEq/L   Glucose, Bld 87  70 - 99 mg/dL   BUN 13  6 - 23 mg/dL   Creatinine,  Ser 0.70  0.50 - 1.10 mg/dL   Calcium 8.9  8.4 - 16.1 mg/dL   Total Protein 7.7  6.0 - 8.3 g/dL   Albumin 3.3 (*) 3.5 - 5.2 g/dL   AST 15  0 - 37 U/L   ALT 8  0 - 35 U/L   Alkaline Phosphatase 63  39 - 117 U/L   Total Bilirubin 0.1 (*) 0.3 - 1.2 mg/dL   GFR calc non Af Amer >90  >90 mL/min   GFR calc Af Amer >90  >90 mL/min  LIPASE, BLOOD      Result Value Range   Lipase 32  11 - 59 U/L  URINALYSIS, ROUTINE W REFLEX MICROSCOPIC      Result Value Range   Color, Urine YELLOW  YELLOW   APPearance CLEAR  CLEAR   Specific Gravity, Urine 1.005  1.005 - 1.030   pH 7.0  5.0 - 8.0   Glucose, UA NEGATIVE  NEGATIVE mg/dL   Hgb urine dipstick TRACE (*) NEGATIVE   Bilirubin Urine NEGATIVE  NEGATIVE   Ketones, ur NEGATIVE  NEGATIVE mg/dL   Protein, ur NEGATIVE  NEGATIVE mg/dL   Urobilinogen, UA 0.2  0.0 - 1.0 mg/dL   Nitrite NEGATIVE  NEGATIVE   Leukocytes, UA NEGATIVE  NEGATIVE  URINE MICROSCOPIC-ADD ON      Result Value Range   Squamous Epithelial / LPF RARE  RARE   WBC, UA 0-2  <3 WBC/hpf   RBC / HPF 0-2  <3 RBC/hpf   Bacteria, UA RARE  RARE  POCT PREGNANCY, URINE      Result Value Range   Preg Test, Ur NEGATIVE  NEGATIVE   No results found.   Imaging Review No results found.  MDM  Gastritis  Patient with a two week history of episodic epigastric pain usually relieved with OTC medication - labs are unconcerning for acute emergent process and pain was completely relieved with protonix and GI cocktail.  I do not feel the need for imaging at this point.     Izola Price  Marisue Humble, PA-C 07/11/13 1006

## 2013-07-11 NOTE — ED Notes (Signed)
Pt c/o epigastric abd pain that started about 2 weeks ago. Stated that she has been taking Zantac which has helped and then this morning at 0200 this morning pt stated that pain did not go away. Denies any n/v/d.

## 2013-07-11 NOTE — ED Provider Notes (Signed)
Medical screening examination/treatment/procedure(s) were performed by non-physician practitioner and as supervising physician I was immediately available for consultation/collaboration.   Junius Argyle, MD 07/11/13 1040

## 2014-06-24 ENCOUNTER — Ambulatory Visit: Payer: Self-pay | Admitting: Internal Medicine

## 2015-07-13 ENCOUNTER — Emergency Department (HOSPITAL_COMMUNITY)
Admission: EM | Admit: 2015-07-13 | Discharge: 2015-07-13 | Disposition: A | Discharge status: Home / Self Care | Payer: Self-pay | Attending: Emergency Medicine | Admitting: Emergency Medicine

## 2015-07-13 ENCOUNTER — Encounter (HOSPITAL_COMMUNITY): Payer: Self-pay | Admitting: Emergency Medicine

## 2015-07-13 ENCOUNTER — Emergency Department (HOSPITAL_COMMUNITY): Payer: Self-pay

## 2015-07-13 DIAGNOSIS — R1013 Epigastric pain: Secondary | ICD-10-CM | POA: Insufficient documentation

## 2015-07-13 DIAGNOSIS — R110 Nausea: Secondary | ICD-10-CM | POA: Insufficient documentation

## 2015-07-13 DIAGNOSIS — R1011 Right upper quadrant pain: Secondary | ICD-10-CM | POA: Insufficient documentation

## 2015-07-13 DIAGNOSIS — R1012 Left upper quadrant pain: Secondary | ICD-10-CM | POA: Insufficient documentation

## 2015-07-13 DIAGNOSIS — Z79899 Other long term (current) drug therapy: Secondary | ICD-10-CM | POA: Insufficient documentation

## 2015-07-13 DIAGNOSIS — Z8719 Personal history of other diseases of the digestive system: Secondary | ICD-10-CM | POA: Insufficient documentation

## 2015-07-13 DIAGNOSIS — Z9851 Tubal ligation status: Secondary | ICD-10-CM | POA: Insufficient documentation

## 2015-07-13 LAB — URINALYSIS, ROUTINE W REFLEX MICROSCOPIC
Bilirubin Urine: NEGATIVE
GLUCOSE, UA: NEGATIVE mg/dL
Hgb urine dipstick: NEGATIVE
KETONES UR: NEGATIVE mg/dL
LEUKOCYTES UA: NEGATIVE
Nitrite: NEGATIVE
PH: 6 (ref 5.0–8.0)
Protein, ur: NEGATIVE mg/dL
SPECIFIC GRAVITY, URINE: 1.021 (ref 1.005–1.030)
Urobilinogen, UA: 1 mg/dL (ref 0.0–1.0)

## 2015-07-13 LAB — COMPREHENSIVE METABOLIC PANEL
ALT: 11 U/L — AB (ref 14–54)
AST: 19 U/L (ref 15–41)
Albumin: 3.7 g/dL (ref 3.5–5.0)
Alkaline Phosphatase: 61 U/L (ref 38–126)
Anion gap: 9 (ref 5–15)
BUN: 10 mg/dL (ref 6–20)
CHLORIDE: 104 mmol/L (ref 101–111)
CO2: 23 mmol/L (ref 22–32)
CREATININE: 0.68 mg/dL (ref 0.44–1.00)
Calcium: 9.2 mg/dL (ref 8.9–10.3)
GFR calc non Af Amer: >60 mL/min (ref >60)
Glucose, Bld: 81 mg/dL (ref 65–99)
Potassium: 3.8 mmol/L (ref 3.5–5.1)
SODIUM: 136 mmol/L (ref 135–145)
Total Bilirubin: 0.4 mg/dL (ref 0.3–1.2)
Total Protein: 8.5 g/dL — ABNORMAL HIGH (ref 6.5–8.1)

## 2015-07-13 LAB — CBC
HEMATOCRIT: 33.3 % — AB (ref 36.0–46.0)
Hemoglobin: 10.1 g/dL — ABNORMAL LOW (ref 12.0–15.0)
MCH: 20.7 pg — AB (ref 26.0–34.0)
MCHC: 30.3 g/dL (ref 30.0–36.0)
MCV: 68.1 fL — AB (ref 78.0–100.0)
PLATELETS: 300 10*3/uL (ref 150–400)
RBC: 4.89 MIL/uL (ref 3.87–5.11)
RDW: 18.3 % — ABNORMAL HIGH (ref 11.5–15.5)
WBC: 7.6 10*3/uL (ref 4.0–10.5)

## 2015-07-13 LAB — LIPASE, BLOOD: LIPASE: 23 U/L (ref 22–51)

## 2015-07-13 LAB — PREGNANCY, URINE: Preg Test, Ur: NEGATIVE

## 2015-07-13 MED ORDER — ONDANSETRON 4 MG PO TBDP
8.0000 mg | ORAL_TABLET | Freq: Once | ORAL | Status: AC
  Administered 2015-07-13: 8 mg via ORAL
  Filled 2015-07-13: qty 2

## 2015-07-13 MED ORDER — PANTOPRAZOLE SODIUM 40 MG IV SOLR
40.0000 mg | Freq: Once | INTRAVENOUS | Status: AC
  Administered 2015-07-13: 40 mg via INTRAVENOUS
  Filled 2015-07-13: qty 40

## 2015-07-13 MED ORDER — GI COCKTAIL ~~LOC~~
30.0000 mL | Freq: Once | ORAL | Status: AC
  Administered 2015-07-13: 30 mL via ORAL
  Filled 2015-07-13: qty 30

## 2015-07-13 MED ORDER — PANTOPRAZOLE SODIUM 20 MG PO TBEC: 20.0000 mg | DELAYED_RELEASE_TABLET | Freq: Every day | ORAL | Status: DC

## 2015-07-13 NOTE — ED Notes (Signed)
Reviewed d/c instructions with pt.  Informed pt that medication may not be chewed/crushed. Pt verbalized understanding.

## 2015-07-13 NOTE — ED Notes (Signed)
Patient states abdominal pain since Saturday.   Patient has some nausea, but denies vomiting/diarrhea.   Patient states pain is intermittent.

## 2015-07-13 NOTE — Discharge Instructions (Signed)
Please take your prescription for Protonix as prescribed. Please follow up with a primary care provider this week for follow up. Please return to the Emergency Department if symptoms worsen or new onset of fever, vomiting, chest pain, difficulty breathing.

## 2015-07-13 NOTE — ED Provider Notes (Signed)
CSN: [id number]     Arrival date & time 07/13/15  0626 History   First MD Initiated Contact with Patient 07/13/15 1149     Chief Complaint  Patient presents with  . Abdominal Pain     (Consider location/radiation/quality/duration/timing/severity/associated sxs/prior Treatment) HPI Comments: Pt is a 44 yo female who presents to the ED with complaint of abdominal pain, onset 2 days. Pt reports intermittent sharp upper abdominal pain. She notes the pain initially started appx. 55min after eating on Saturday and then she had another episode at 1am last night which woke her up. Endorses nausea. Denies fever, chills, headache, sore throat, SOB, CP, vomiting, diarrhea, constipation, urinary sxs, blood in urine or stool. She notes she has had pain similar to this in the past (2 years ago) and was seen in the ED, dx with gastritis and d/c home. Pt reports she has taken tylenol and zantec PTA with mild relief. Pt does not have a PCP and denies having any follow up after her last ED visit for similar pain. History of tubal ligation. LMP 07/03/2015.   History reviewed. No pertinent past medical history. Past Surgical History  Procedure Laterality Date  . Tubal ligation     No family history on file. Social History  Substance Use Topics  . Smoking status: Never Smoker   . Smokeless tobacco: None  . Alcohol Use: No   OB History    No data available     Review of Systems  Gastrointestinal: Positive for nausea and abdominal pain.  All other systems reviewed and are negative.     Allergies  Codeine  Home Medications   Prior to Admission medications   Medication Sig Start Date End Date Taking? Authorizing Provider  Alum & Mag Hydroxide-Simeth (GI COCKTAIL) SUSP suspension Take 30 mLs by mouth 3 (three) times daily. Shake well. 07/11/13   Ignacia Felling, PA-C  ranitidine (ZANTAC) 150 MG tablet Take 150 mg by mouth daily as needed for heartburn (stomach pain).    Historical Provider, MD   ranitidine (ZANTAC) 150 MG tablet Take 1 tablet (150 mg total) by mouth 2 (two) times daily. 07/11/13   Ignacia Felling, PA-C   BP 141/97 mmHg  Pulse 65  Temp(Src) 98.4 F (36.9 C) (Oral)  Resp 16  Ht 5' (1.524 m)  Wt 140 lb (63.504 kg)  BMI 27.34 kg/m2  SpO2 100%  LMP 07/11/2015 Physical Exam  Constitutional: She is oriented to person, place, and time. She appears well-developed and well-nourished.  HENT:  Head: Normocephalic and atraumatic.  Mouth/Throat: Oropharynx is clear and moist.  Eyes: Conjunctivae and EOM are normal. Pupils are equal, round, and reactive to light. Right eye exhibits no discharge. Left eye exhibits no discharge. No scleral icterus.  Neck: Normal range of motion. Neck supple.  Cardiovascular: Normal rate, regular rhythm, normal heart sounds and intact distal pulses.   Pulmonary/Chest: Effort normal and breath sounds normal. She has no wheezes. She has no rales. She exhibits no tenderness.  Abdominal: Soft. Bowel sounds are normal. She exhibits no distension and no mass. There is tenderness in the right upper quadrant, epigastric area and left upper quadrant. There is no rigidity, no rebound, no guarding and no CVA tenderness.  Musculoskeletal: Normal range of motion. She exhibits no edema.  Lymphadenopathy:    She has no cervical adenopathy.  Neurological: She is alert and oriented to person, place, and time.  Skin: Skin is warm and dry.  Nursing note and vitals reviewed.   ED  Course  Procedures (including critical care time) Labs Review Labs Reviewed  COMPREHENSIVE METABOLIC PANEL - Abnormal; Notable for the following:    Total Protein 8.5 (*)    ALT 11 (*)    All other components within normal limits  CBC - Abnormal; Notable for the following:    Hemoglobin 10.1 (*)    HCT 33.3 (*)    MCV 68.1 (*)    MCH 20.7 (*)    RDW 18.3 (*)    All other components within normal limits  LIPASE, BLOOD  URINALYSIS, ROUTINE W REFLEX MICROSCOPIC (NOT AT [facility])   PREGNANCY, URINE    Imaging Review No results found. I have personally reviewed and evaluated these images and lab results as part of my medical decision-making.  Filed Vitals:   07/13/15 1610  BP: 137/80  Pulse: 57  Temp: 98.3 F (36.8 C)  Resp: 16   Meds given in ED:  Medications  ondansetron (ZOFRAN-ODT) disintegrating tablet 8 mg (8 mg Oral Given 07/13/15 1307)  gi cocktail (Maalox,Lidocaine,Donnatal) (30 mLs Oral Given 07/13/15 1307)  pantoprazole (PROTONIX) injection 40 mg (40 mg Intravenous Given 07/13/15 1333)     MDM   Final diagnoses:  Epigastric pain    Pt presents with intermittent upper abdominal pain with nausea. VSS.  RUQ, epigastric and LUQ TTP. Pt given GI cocktail, zofran and protonix. CBC, CMP, Lipase, UA unremarkable. Urine pregnancy negative. [place] ordered to examine gallbladder due to RUQ pain. Abdominal US revealed no gallstones or wall thickening. At this time I do not suspect gallbladder or pancreas etiologies or kidney stone. Pain is likely due to reflux or gastritis. Plan to d/c pt home with rx for zofran and protonix. Advised pt to follow up with PCP from resource guide this week.   Evaluation does not show pathology requring ongoing emergent intervention or admission. Pt is hemodynamically stable and mentating appropriately. Discussed findings/results and plan with patient/guardian, who agrees with plan. All questions answered. Return precautions discussed and outpatient follow up given.      Jenna Weaver, [place] 07/13/15 2049  Jenna Greek, MD 07/14/15 [phone]

## 2015-11-12 ENCOUNTER — Emergency Department (HOSPITAL_COMMUNITY): Payer: BLUE CROSS/BLUE SHIELD

## 2015-11-12 ENCOUNTER — Emergency Department (HOSPITAL_COMMUNITY)
Admission: EM | Admit: 2015-11-12 | Discharge: 2015-11-12 | Disposition: A | Discharge status: Home / Self Care | Payer: BLUE CROSS/BLUE SHIELD | Attending: Emergency Medicine | Admitting: Emergency Medicine

## 2015-11-12 ENCOUNTER — Encounter (HOSPITAL_COMMUNITY): Payer: Self-pay

## 2015-11-12 DIAGNOSIS — Z3202 Encounter for pregnancy test, result negative: Secondary | ICD-10-CM | POA: Diagnosis not present

## 2015-11-12 DIAGNOSIS — Z79899 Other long term (current) drug therapy: Secondary | ICD-10-CM | POA: Insufficient documentation

## 2015-11-12 DIAGNOSIS — R1011 Right upper quadrant pain: Secondary | ICD-10-CM | POA: Insufficient documentation

## 2015-11-12 LAB — URINALYSIS, ROUTINE W REFLEX MICROSCOPIC
BILIRUBIN URINE: NEGATIVE
Glucose, UA: NEGATIVE mg/dL
Hgb urine dipstick: NEGATIVE
KETONES UR: NEGATIVE mg/dL
LEUKOCYTES UA: NEGATIVE
NITRITE: NEGATIVE
PH: 7 (ref 5.0–8.0)
Protein, ur: NEGATIVE mg/dL
SPECIFIC GRAVITY, URINE: 1.018 (ref 1.005–1.030)

## 2015-11-12 LAB — COMPREHENSIVE METABOLIC PANEL
ALBUMIN: 3.4 g/dL — AB (ref 3.5–5.0)
ALT: 10 U/L — ABNORMAL LOW (ref 14–54)
AST: 17 U/L (ref 15–41)
Alkaline Phosphatase: 55 U/L (ref 38–126)
Anion gap: 8 (ref 5–15)
BUN: 9 mg/dL (ref 6–20)
CHLORIDE: 107 mmol/L (ref 101–111)
CO2: 22 mmol/L (ref 22–32)
Calcium: 8.8 mg/dL — ABNORMAL LOW (ref 8.9–10.3)
Creatinine, Ser: 0.64 mg/dL (ref 0.44–1.00)
GFR calc Af Amer: >60 mL/min (ref >60)
GFR calc non Af Amer: >60 mL/min (ref >60)
GLUCOSE: 87 mg/dL (ref 65–99)
POTASSIUM: 3.7 mmol/L (ref 3.5–5.1)
SODIUM: 137 mmol/L (ref 135–145)
Total Bilirubin: 0.2 mg/dL — ABNORMAL LOW (ref 0.3–1.2)
Total Protein: 7.2 g/dL (ref 6.5–8.1)

## 2015-11-12 LAB — CBC
HEMATOCRIT: 30.8 % — AB (ref 36.0–46.0)
Hemoglobin: 9.5 g/dL — ABNORMAL LOW (ref 12.0–15.0)
MCH: 20.7 pg — ABNORMAL LOW (ref 26.0–34.0)
MCHC: 30.8 g/dL (ref 30.0–36.0)
MCV: 67.2 fL — AB (ref 78.0–100.0)
Platelets: 284 10*3/uL (ref 150–400)
RBC: 4.58 MIL/uL (ref 3.87–5.11)
RDW: 18.7 % — AB (ref 11.5–15.5)
WBC: 7.2 10*3/uL (ref 4.0–10.5)

## 2015-11-12 LAB — POC URINE PREG, ED: PREG TEST UR: NEGATIVE

## 2015-11-12 LAB — LIPASE, BLOOD: LIPASE: 24 U/L (ref 11–51)

## 2015-11-12 MED ORDER — OXYCODONE-ACETAMINOPHEN 5-325 MG PO TABS
1.0000 | ORAL_TABLET | Freq: Once | ORAL | Status: AC
  Administered 2015-11-12: 1 via ORAL

## 2015-11-12 MED ORDER — OXYCODONE-ACETAMINOPHEN 5-325 MG PO TABS
ORAL_TABLET | ORAL | Status: AC
  Filled 2015-11-12: qty 1

## 2015-11-12 MED ORDER — POLYETHYLENE GLYCOL 3350 17 G PO PACK: 17.0000 g | PACK | Freq: Every day | ORAL | Status: DC

## 2015-11-12 NOTE — ED Notes (Signed)
Pt started having right sided abd pain last night about 2000. It has been intermittent all night. Took one of her mom's pain pills at 0200 and was able to go to sleep but then it woke her up. Denies any urinary symptoms. Denies N/V or vaginal symptoms.

## 2015-11-12 NOTE — ED Provider Notes (Signed)
CSN: [id number]     Arrival date & time 11/12/15  0525 History   First MD Initiated Contact with Patient 11/12/15 [phone]     Chief Complaint  Patient presents with  . Abdominal Pain   45 year old African making female who presents today with acute onset of right-sided abdominal pain. Pain began yesterday at 8 PM after gradual onset of uncomfortable sensation the day before. Pain is sharp, nonradiating, constant. Initially try to take an off brand Tylenol without any improvement. Woke up at 10 PM as the pain increased to 10 out of 10. Overnight pain remained constant. In triage here patient was given a Norco with mild improvement in pain to an 8 out of 10. Not worse with movement.  Denies fevers, chills, nausea, vomiting, dysuria, hematuria, flank pain, increased frequency of urination, chest pain, diarrhea, constipation, sick contacts, or recent travel.  (Consider location/radiation/quality/duration/timing/severity/associated sxs/prior Treatment) Patient is a 45 y.o. female presenting with abdominal pain.  Abdominal Pain Pain location:  RUQ Pain quality: sharp   Pain radiates to:  Does not radiate Timing:  Constant Progression:  Unchanged Chronicity:  New Relieved by:  Nothing Worsened by:  Nothing tried Ineffective treatments:  Acetaminophen Associated symptoms: no chest pain, no chills, no constipation, no diarrhea, no dysuria, no fever, no nausea, no shortness of breath, no vaginal bleeding, no vaginal discharge and no vomiting     History reviewed. No pertinent past medical history. Past Surgical History  Procedure Laterality Date  . Tubal ligation     No family history on file. Social History  Substance Use Topics  . Smoking status: Never Smoker   . Smokeless tobacco: None  . Alcohol Use: No   OB History    No data available     Review of Systems  Constitutional: Negative for fever and chills.  Respiratory: Negative for shortness of breath.   Cardiovascular: Negative  for chest pain, palpitations and leg swelling.  Gastrointestinal: Positive for abdominal pain. Negative for nausea, vomiting, diarrhea, constipation and abdominal distention.  Genitourinary: Negative for dysuria, frequency, flank pain, decreased urine volume, vaginal bleeding, vaginal discharge, vaginal pain and pelvic pain.  Musculoskeletal: Negative for back pain.  Skin: Negative for rash.  Neurological: Negative for dizziness, speech difficulty, light-headedness and headaches.  All other systems reviewed and are negative.     Allergies  Codeine  Home Medications   Prior to Admission medications   Medication Sig Start Date End Date Taking? Authorizing Provider  Alum & Mag Hydroxide-Simeth (GI COCKTAIL) SUSP suspension Take 30 mLs by mouth 3 (three) times daily. Shake well. Patient not taking: Reported on 07/13/2015 07/11/13   Ignacia Felling, PA-C  pantoprazole (PROTONIX) 20 MG tablet Take 1 tablet (20 mg total) by mouth daily. Patient not taking: Reported on 11/12/2015 07/13/15   Nona Dell, PA-C  polyethylene glycol [facility]) packet Take 17 g by mouth daily. 11/12/15   Sherian Maroon, MD  ranitidine (ZANTAC) 150 MG tablet Take 150 mg by mouth daily as needed for heartburn (stomach pain). Reported on 11/12/2015    Historical Provider, MD  ranitidine (ZANTAC) 150 MG tablet Take 1 tablet (150 mg total) by mouth 2 (two) times daily. Patient not taking: Reported on 07/13/2015 07/11/13   Ignacia Felling, PA-C   BP 114/70 mmHg  Pulse 79  Temp(Src) 98.5 F (36.9 C) (Oral)  Resp 18  Ht 5' (1.524 m)  Wt 83.008 kg  BMI 35.74 kg/m2  SpO2 99%  LMP 10/29/2015 Physical Exam  Constitutional: She is  oriented to person, place, and time. She appears well-developed and well-nourished. No distress.  HENT:  Head: Normocephalic and atraumatic.  Cardiovascular: Normal rate, regular rhythm, normal heart sounds and intact distal pulses.  Exam reveals no gallop and no friction rub.   No murmur  heard. Pulmonary/Chest: Effort normal and breath sounds normal. No respiratory distress. She has no wheezes. She has no rales. She exhibits no tenderness.  Abdominal: Soft. Bowel sounds are normal. She exhibits no distension and no mass. There is no tenderness. There is no rebound and no guarding.  Lymphadenopathy:    She has no cervical adenopathy.  Neurological: She is alert and oriented to person, place, and time. No cranial nerve deficit. Coordination normal.  Skin: Skin is warm and dry. She is not diaphoretic.  Nursing note and vitals reviewed.   ED Course  Procedures (including critical care time) Labs Review Labs Reviewed  COMPREHENSIVE METABOLIC PANEL - Abnormal; Notable for the following:    Calcium 8.8 (*)    Albumin 3.4 (*)    ALT 10 (*)    Total Bilirubin 0.2 (*)    All other components within normal limits  CBC - Abnormal; Notable for the following:    Hemoglobin 9.5 (*)    HCT 30.8 (*)    MCV 67.2 (*)    MCH 20.7 (*)    RDW 18.7 (*)    All other components within normal limits  URINALYSIS, ROUTINE W REFLEX MICROSCOPIC (NOT AT [facility]) - Abnormal; Notable for the following:    APPearance CLOUDY (*)    All other components within normal limits  LIPASE, BLOOD  POC URINE PREG, ED    Imaging Review Dg Abd 1 View  11/12/2015  CLINICAL DATA:  Right-sided abdominal pain extending into the right lower quadrant for 2 days. EXAM: ABDOMEN - 1 VIEW COMPARISON:  Ultrasound 07/13/2015.  CT 11/14/2012. FINDINGS: The bowel gas pattern is normal. There is no supine evidence of free intraperitoneal air. Calcified uterine fibroids are grossly stable. The bones appear unremarkable. IMPRESSION: No acute abdominal findings demonstrated radiographically. Electronically Signed   By: Richardean Sale M.D.   On: 11/12/2015 09:08   I have personally reviewed and evaluated these images and lab results as part of my medical decision-making.   EKG Interpretation None      MDM   Final  diagnoses:  Right upper quadrant abdominal pain   46 year old African-American female with right-sided abdominal pain. See history of present illness for details. On exam patient in NAD, AF, vital stable. Abdominal exam completely benign with no tenderness reproduced. Not consistent with ectopic, pancreatitis, cholecystitis, cholelithiasis, appendicitis, colitis, kidney stone, SBO.  Labs reassuring, repeat exam reassuring. Bimanual exam reveals no ovarian mass or tenderness to cause pain: unlikely to be torsion or ovarian abscess. Possibly bowel gas as patient has history of constipation that does require MiraLAX somewhat frequently. There is stool burden in the right.   Safe for DC home w/rx for stool softener. Strict return if severe pain or systemic signs of infxn.   Pt was seen under the supervision of Dr. Maryan Rued.     Sherian Maroon, MD 11/12/15 [id number]  Blanchie Dessert, MD 11/12/15 2135

## 2015-11-12 NOTE — Discharge Instructions (Signed)

## 2015-11-18 ENCOUNTER — Emergency Department (HOSPITAL_COMMUNITY)
Admission: EM | Admit: 2015-11-18 | Discharge: 2015-11-18 | Disposition: A | Discharge status: Home / Self Care | Payer: BLUE CROSS/BLUE SHIELD

## 2015-11-18 NOTE — ED Notes (Signed)
Called pt to be triaged, no response.  

## 2015-11-18 NOTE — ED Notes (Signed)
Called for triage, no answer

## 2015-11-18 NOTE — ED Notes (Signed)
Called pt to be triaged 2X, no response 

## 2017-07-30 ENCOUNTER — Emergency Department (HOSPITAL_COMMUNITY)
Admission: EM | Admit: 2017-07-30 | Discharge: 2017-07-30 | Disposition: A | Discharge status: Home / Self Care | Payer: BLUE CROSS/BLUE SHIELD | Attending: Emergency Medicine | Admitting: Emergency Medicine

## 2017-07-30 ENCOUNTER — Encounter (HOSPITAL_COMMUNITY): Payer: Self-pay | Admitting: Emergency Medicine

## 2017-07-30 DIAGNOSIS — Z79899 Other long term (current) drug therapy: Secondary | ICD-10-CM | POA: Insufficient documentation

## 2017-07-30 DIAGNOSIS — L089 Local infection of the skin and subcutaneous tissue, unspecified: Secondary | ICD-10-CM | POA: Insufficient documentation

## 2017-07-30 MED ORDER — CLINDAMYCIN HCL 300 MG PO CAPS: 300.0000 mg | ORAL_CAPSULE | Freq: Three times a day (TID) | ORAL | 0 refills | Status: DC

## 2017-07-30 MED ORDER — DOXYCYCLINE HYCLATE 100 MG PO CAPS: 100.0000 mg | ORAL_CAPSULE | Freq: Two times a day (BID) | ORAL | 0 refills | Status: DC

## 2017-07-30 NOTE — Discharge Instructions (Signed)
Warm soaks and gentle massage of area three times per day.

## 2017-07-30 NOTE — ED Notes (Signed)
Calling PT but no answer from lobby area, sending text message to PT

## 2017-07-30 NOTE — ED Provider Notes (Signed)
[place] DEPT Provider Note   CSN: [id number] Arrival date & time: 07/30/17  1120     History   Chief Complaint Chief Complaint  Patient presents with  . Recurrent Skin Infections    HPI Jenna Weaver is a 46 y.o. female.HPI chief complaint is leg redness, possible infection.  Peripheral female. Noticed a painful area on her right upper thigh at work yesterday. His red today. It "popped "started draining. Still tender. Skipped work, came here.  History reviewed. No pertinent past medical history.  Patient Active Problem List   Diagnosis Date Noted  . BV (bacterial vaginosis) 03/23/2013  . Cervicitis 03/23/2013    Past Surgical History:  Procedure Laterality Date  . TUBAL LIGATION      OB History    No data available       Home Medications    Prior to Admission medications   Medication Sig Start Date End Date Taking? Authorizing Provider  Alum & Mag Hydroxide-Simeth (GI COCKTAIL) SUSP suspension Take 30 mLs by mouth 3 (three) times daily. Shake well. Patient not taking: Reported on 07/13/2015 07/11/13   Ignacia Felling, PA-C  clindamycin (CLEOCIN) 300 MG capsule Take 1 capsule (300 mg total) by mouth 3 (three) times daily. 07/30/17   Tanna Furry, MD  doxycycline (VIBRAMYCIN) 100 MG capsule Take 1 capsule (100 mg total) by mouth 2 (two) times daily. 07/30/17   Tanna Furry, MD  pantoprazole (PROTONIX) 20 MG tablet Take 1 tablet (20 mg total) by mouth daily. Patient not taking: Reported on 11/12/2015 07/13/15   Nona Dell, PA-C  polyethylene glycol [facility]) packet Take 17 g by mouth daily. 11/12/15   Sherian Maroon, MD  ranitidine (ZANTAC) 150 MG tablet Take 150 mg by mouth daily as needed for heartburn (stomach pain). Reported on 11/12/2015    [provider]  ranitidine (ZANTAC) 150 MG tablet Take 1 tablet (150 mg total) by mouth 2 (two) times daily. Patient not taking: Reported on 07/13/2015 07/11/13   Ignacia Felling, PA-C    Family  History No family history on file.  Social History Social History  Substance Use Topics  . Smoking status: Never Smoker  . Smokeless tobacco: Not on file  . Alcohol use No     Allergies   Codeine   Review of Systems Review of Systems  Constitutional: Negative for appetite change, chills, diaphoresis, fatigue and fever.  HENT: Negative for mouth sores, sore throat and trouble swallowing.   Eyes: Negative for visual disturbance.  Respiratory: Negative for cough, chest tightness, shortness of breath and wheezing.   Cardiovascular: Negative for chest pain.  Gastrointestinal: Negative for abdominal distention, abdominal pain, diarrhea, nausea and vomiting.  Endocrine: Negative for polydipsia, polyphagia and polyuria.  Genitourinary: Negative for dysuria, frequency and hematuria.  Musculoskeletal: Negative for gait problem.  Skin: Positive for color change, rash and wound. Negative for pallor.  Neurological: Negative for dizziness, syncope, light-headedness and headaches.  Hematological: Does not bruise/bleed easily.  Psychiatric/Behavioral: Negative for behavioral problems and confusion.     Physical Exam Updated Vital Signs BP 140/78   Pulse 76   Temp 98.5 F (36.9 C) (Oral)   Resp 15   LMP 07/16/2017 (Approximate)   SpO2 100%   Physical Exam  Constitutional: She is oriented to person, place, and time. She appears well-developed and well-nourished. No distress.  HENT:  Head: Normocephalic.  Eyes: Pupils are equal, round, and reactive to light. Conjunctivae are normal. No scleral icterus.  Neck: Normal range of motion.  Neck supple. No thyromegaly present.  Cardiovascular: Normal rate and regular rhythm.  Exam reveals no gallop and no friction rub.   No murmur heard. Pulmonary/Chest: Effort normal and breath sounds normal. No respiratory distress. She has no wheezes. She has no rales.  Abdominal: Soft. Bowel sounds are normal. She exhibits no distension. There is no  tenderness. There is no rebound.  Musculoskeletal: Normal range of motion.  Neurological: She is alert and oriented to person, place, and time.  Skin: Skin is warm and dry. No rash noted.  . Erythema without fluctuance and a small ruptured draining break in the skin in the center of the right upper medial thigh. No palpable fluctuance. No indication for incision and drainage.  Psychiatric: She has a normal mood and affect. Her behavior is normal.     ED Treatments / Results  Labs (all labs ordered are listed, but only abnormal results are displayed) Labs Reviewed - No data to display  EKG  EKG Interpretation None       Radiology No results found.  Procedures Procedures (including critical care time)  Medications Ordered in ED Medications - No data to display   Initial Impression / Assessment and Plan / ED Course  I have reviewed the triage vital signs and the nursing notes.  Pertinent labs & imaging results that were available during my care of the patient were reviewed by me and considered in my medical decision making (see chart for details).     Skin infection, cellulitis. They have been ruptured bullae which could be staph. May be simple strep cellulitis. Could be MRSA with minimal ruptured abscess. We clindamycin which we did coverage for all of the above. Warm compresses and gentle massage. ER if not improving.  Final Clinical Impressions(s) / ED Diagnoses   Final diagnoses:  Skin infection    New Prescriptions New Prescriptions   CLINDAMYCIN (CLEOCIN) 300 MG CAPSULE    Take 1 capsule (300 mg total) by mouth 3 (three) times daily.   DOXYCYCLINE (VIBRAMYCIN) 100 MG CAPSULE    Take 1 capsule (100 mg total) by mouth 2 (two) times daily.     Tanna Furry, MD 07/30/17 [phone]

## 2017-07-30 NOTE — ED Triage Notes (Signed)
Pt reports boil to right groin, states lesion started draining today.

## 2018-03-19 ENCOUNTER — Other Ambulatory Visit: Payer: Self-pay

## 2018-03-19 ENCOUNTER — Encounter (HOSPITAL_COMMUNITY): Payer: Self-pay

## 2018-03-19 ENCOUNTER — Emergency Department (HOSPITAL_COMMUNITY)
Admission: EM | Admit: 2018-03-19 | Discharge: 2018-03-19 | Disposition: A | Discharge status: Home / Self Care | Payer: Self-pay | Attending: Emergency Medicine | Admitting: Emergency Medicine

## 2018-03-19 DIAGNOSIS — M5442 Lumbago with sciatica, left side: Secondary | ICD-10-CM | POA: Insufficient documentation

## 2018-03-19 MED ORDER — PREDNISONE 10 MG (21) PO TBPK: ORAL_TABLET | Freq: Every day | ORAL | 0 refills | Status: DC

## 2018-03-19 MED ORDER — LIDOCAINE 5 % EX PTCH: 1.0000 | MEDICATED_PATCH | CUTANEOUS | 0 refills | Status: DC

## 2018-03-19 MED ORDER — KETOROLAC TROMETHAMINE 30 MG/ML IJ SOLN
30.0000 mg | Freq: Once | INTRAMUSCULAR | Status: AC
  Administered 2018-03-19: 30 mg via INTRAMUSCULAR
  Filled 2018-03-19: qty 1

## 2018-03-19 MED ORDER — MELOXICAM 15 MG PO TABS: 15.0000 mg | ORAL_TABLET | Freq: Every day | ORAL | 0 refills | Status: DC

## 2018-03-19 MED ORDER — METHOCARBAMOL 500 MG PO TABS: 500.0000 mg | ORAL_TABLET | Freq: Every evening | ORAL | 0 refills | Status: DC | PRN

## 2018-03-19 NOTE — ED Provider Notes (Signed)
[facility]ERG[facility] DEPARTMENT Provider Note   CSN: [id number] Arrival date & time: 03/19/18  1132     History   Chief Complaint Chief Complaint  Patient presents with  . Leg Pain  . Back Pain    HPI Jenna Weaver is a 47 y.o. female past medical history presents emergency department today for left low back pain.  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  She notes that she had a pulling sensation of her left lower back after moving a patient.  She states this pain radiates down her leg past her knee and into her foot.  She states there is associated tingling sensation.  Symptoms are worsened with ambulation as well as bending down and when moving heavy objects.  She is tried over-the-counter medication for this without any relief. Denies history of cancer, trauma, fever, night pain, IV drug use, recent spinal manipulation or procedures, upper back pain or neck pain, numbness/weakness of the lower extremities, urinary retention, loss of bowel/bladder function, saddle anesthesia, or unexplained weight loss. Denies dysuria, flank pain, suprapubic pain, frequency, urgency, or hematuria.   HPI  History reviewed. No pertinent past medical history.  Patient Active Problem List   Diagnosis Date Noted  . BV (bacterial vaginosis) 03/23/2013  . Cervicitis 03/23/2013    Past Surgical History:  Procedure Laterality Date  . TUBAL LIGATION       OB History   None      Home Medications    Prior to Admission medications   Medication Sig Start Date End Date Taking? Authorizing Provider  Alum & Mag Hydroxide-Simeth (GI COCKTAIL) SUSP suspension Take 30 mLs by mouth 3 (three) times daily. Shake well. Patient not taking: Reported on 07/13/2015 07/11/13   Ignacia Felling, PA-C  clindamycin (CLEOCIN) 300 MG capsule Take 1 capsule (300 mg total) by mouth 3 (three) times daily. 07/30/17   Tanna Furry, MD  doxycycline  (VIBRAMYCIN) 100 MG capsule Take 1 capsule (100 mg total) by mouth 2 (two) times daily. 07/30/17   Tanna Furry, MD  pantoprazole (PROTONIX) 20 MG tablet Take 1 tablet (20 mg total) by mouth daily. Patient not taking: Reported on 11/12/2015 07/13/15   Nona Dell, PA-C  polyethylene glycol [facility]) packet Take 17 g by mouth daily. 11/12/15   Sherian Maroon, MD  ranitidine (ZANTAC) 150 MG tablet Take 150 mg by mouth daily as needed for heartburn (stomach pain). Reported on 11/12/2015    [provider]  ranitidine (ZANTAC) 150 MG tablet Take 1 tablet (150 mg total) by mouth 2 (two) times daily. Patient not taking: Reported on 07/13/2015 07/11/13   Ignacia Felling, PA-C    Family History No family history on file.  Social History Social History   Tobacco Use  . Smoking status: Never Smoker  . Smokeless tobacco: Never Used  Substance Use Topics  . Alcohol use: No  . Drug use: No     Allergies   Codeine   Review of Systems Review of Systems  All other systems reviewed and are negative.    Physical Exam Updated Vital Signs BP 136/85 (BP Location: Right Arm)   Pulse 67   Temp 98.3 F (36.8 C) (Oral)   Resp 16   SpO2 99%   Physical Exam  Constitutional: She appears well-developed and well-nourished. No distress.  Non-toxic appearing  HENT:  Head: Normocephalic and atraumatic.  Right Ear: External ear normal.  Left Ear: External ear normal.  Neck: Normal range of motion. Neck supple. No spinous process tenderness present. No neck rigidity. Normal range of motion present.  Cardiovascular: Normal rate, regular rhythm, normal heart sounds and intact distal pulses.  No murmur heard. Pulses:      Radial pulses are 2+ on the right side, and 2+ on the left side.       Femoral pulses are 2+ on the right side, and 2+ on the left side.      Dorsalis pedis pulses are 2+ on the right side, and 2+ on the left side.       Posterior tibial pulses are 2+ on the right  side, and 2+ on the left side.  Pulmonary/Chest: Effort normal and breath sounds normal. No respiratory distress.  Abdominal: Soft. Bowel sounds are normal. She exhibits no pulsatile midline mass. There is no tenderness. There is no rigidity, no rebound and no CVA tenderness.  Musculoskeletal:  Posterior and appearance appears normal. No evidence of obvious scoliosis or kyphosis. No obvious signs of skin changes, trauma, deformity, infection. No C, T, or L spine tenderness or step-offs to palpation. No C, T paraspinal tenderness.  Left lumbar paraspinal tenderness palpation as well as left gluteal and left proximal IT band/hip.   No sacral crepitus.  Negative logroll test bilaterally.  Lung expansion normal. Bilateral lower extremity strength 5 out of 5 including extensor hallucis longus. Patellar and Achilles deep tendon reflex 2+ and equal bilaterally. Sensation of lower extremities grossly intact. Gait able but patient notes painful. Lower extremity compartments soft. PT and DP 2+ b/l. Cap refill <2 seconds.   Neurological: She is alert.  No foot drop  Skin: Skin is warm, dry and intact. Capillary refill takes less than 2 seconds. No rash noted. She is not diaphoretic. No erythema.  No vesicular-like rash noted.  Nursing note and vitals reviewed.    ED Treatments / Results  Labs (all labs ordered are listed, but only abnormal results are displayed) Labs Reviewed - No data to display  EKG None  Radiology No results found.  Procedures Procedures (including critical care time)  Medications Ordered in ED Medications  ketorolac (TORADOL) 30 MG/ML injection 30 mg (has no administration in time range)     Initial Impression / Assessment and Plan / ED Course  I have reviewed the triage vital signs and the nursing notes.  Pertinent labs & imaging results that were available during my care of the patient were reviewed by me and considered in my medical decision making (see chart for  details).     47 y.o. female with left low back pain with radiation into her left leg. Normal neurological exam, no evidence of urinary incontinence or retention, pain is consistently reproducible. There is no evidence of pulsatile mass, non tender abdomen, equal pulses of the lower extremities and reassuring BP. No concern for dissection at this time.   Patient can walk but states is painful.  No loss of bowel or bladder control.  No concern for cauda equina.  No fever, night sweats, weight loss, h/o cancer, IVDU.  Pain treated here in the department with adequate improvement. Patient to follow up with PCP or ortho if symptoms do not improve. RICE protocol and pain medicine indicated and discussed with patient. I have also discussed reasons to return immediately to the ER.  Patient expresses understanding and agrees with plan.  Final Clinical Impressions(s) / ED Diagnoses   Final diagnoses:  Acute left-sided  low back pain with left-sided sciatica    ED Discharge Orders        Ordered    lidocaine (LIDODERM) 5 %  Every 24 hours     03/19/18 1508    meloxicam (MOBIC) 15 MG tablet  Daily     03/19/18 1508    methocarbamol (ROBAXIN) 500 MG tablet  At bedtime PRN     03/19/18 1508    predniSONE (STERAPRED UNI-PAK 21 TAB) 10 MG (21) TBPK tablet  Daily     03/19/18 1508       Lorelle Gibbs 03/19/18 1509    Margette Fast, MD 03/19/18 1924

## 2018-03-19 NOTE — Discharge Instructions (Addendum)
You were seen here today for Back Pain: Low back pain is discomfort in the lower back that may be due to injuries to muscles and ligaments around the spine. Occasionally, it may be caused by a problem to a part of the spine called a disc. Your back pain should be treated with medicines listed below as well as back exercises and this back pain should get better over the next 2 weeks. Most patients get completely well in 4 weeks. It is important to know however, if you develop severe or worsening pain, low back pain with fever, numbness, weakness or inability to walk or urinate, you should return to the ER immediately.  Please follow up with your doctor this week for a recheck if still having symptoms.  HOME INSTRUCTIONS Self - care:  The application of heat can help soothe the pain.  Maintaining your daily activities, including walking (this is encouraged), as it will help you get better faster than just staying in bed. Do not life, push, pull anything more than 10 pounds for the next week. I am attaching back exercises that you can do at home to help facilitate your recovery.   Back Exercises - I have attached a handout on back exercises that can be done at home to help facilitate your recovery.   Medications are also useful to help with pain control.   Acetaminophen.  This medication is generally safe, and found over the counter. Take as directed for your age. You should not take more than 8 of the extra strength (500mg ) pills a day (max dose is 4000mg  total OVER one day)  Non steroidal anti inflammatory: This includes medications including Ibuprofen, naproxen and Mobic; These medications help both pain and swelling and are very useful in treating back pain.  They should be taken with food, as they can cause stomach upset, and more seriously, stomach bleeding. Do not combine the medications.  Muscle relaxants:  These medications can help with muscle tightness that is a cause of lower back pain.  Most  of these medications can cause drowsiness, and it is not safe to drive or use dangerous machinery while taking them. They are primarily helpful when taken at night before sleep.  Prednisone - Prednisone can be used for low back pain when nerves such as the sciatic nerve are though to be involved. This medication will help decrease the inflammation around the nerve and help facilitate faster recovery. This type of medicine is "tapered" which means you take a little less as time goes on. Call your pharmacist if you have any questions.  You will need to follow up with your primary healthcare provider or the Orthopedist in 1-2 weeks for reassessment and persistent symptoms.  Be aware that if you develop new symptoms, such as a fever, leg weakness, difficulty with or loss of control of your urine or bowels, abdominal pain, or more severe pain, you will need to seek medical attention and/or return to the Emergency department.  Additional Information:  Your vital signs today were: BP 136/85 (BP Location: Right Arm)    Pulse 67    Temp 98.3 F (36.8 C) (Oral)    Resp 16    SpO2 99%  If your blood pressure (BP) was elevated above 135/85 this visit, please have this repeated by your doctor within one month. ---------------

## 2018-03-19 NOTE — ED Triage Notes (Signed)
PT report pain to lower back that radiates down entire left leg. PT states the pain began Friday with no known injury.

## 2018-07-24 ENCOUNTER — Other Ambulatory Visit: Payer: Self-pay

## 2018-07-24 ENCOUNTER — Emergency Department (HOSPITAL_COMMUNITY): Payer: Self-pay

## 2018-07-24 ENCOUNTER — Emergency Department (HOSPITAL_COMMUNITY)
Admission: EM | Admit: 2018-07-24 | Discharge: 2018-07-24 | Disposition: A | Discharge status: Home / Self Care | Payer: Self-pay | Attending: Emergency Medicine | Admitting: Emergency Medicine

## 2018-07-24 ENCOUNTER — Encounter (HOSPITAL_COMMUNITY): Payer: Self-pay

## 2018-07-24 DIAGNOSIS — M5412 Radiculopathy, cervical region: Secondary | ICD-10-CM | POA: Insufficient documentation

## 2018-07-24 DIAGNOSIS — R109 Unspecified abdominal pain: Secondary | ICD-10-CM | POA: Insufficient documentation

## 2018-07-24 DIAGNOSIS — M25511 Pain in right shoulder: Secondary | ICD-10-CM | POA: Insufficient documentation

## 2018-07-24 DIAGNOSIS — D649 Anemia, unspecified: Secondary | ICD-10-CM | POA: Insufficient documentation

## 2018-07-24 LAB — COMPREHENSIVE METABOLIC PANEL
ALK PHOS: 65 U/L (ref 38–126)
ALT: 10 U/L (ref 0–44)
AST: 19 U/L (ref 15–41)
Albumin: 3.5 g/dL (ref 3.5–5.0)
Anion gap: 10 (ref 5–15)
BILIRUBIN TOTAL: 0.4 mg/dL (ref 0.3–1.2)
BUN: 9 mg/dL (ref 6–20)
CALCIUM: 8.9 mg/dL (ref 8.9–10.3)
CO2: 21 mmol/L — AB (ref 22–32)
CREATININE: 0.79 mg/dL (ref 0.44–1.00)
Chloride: 107 mmol/L (ref 98–111)
Glucose, Bld: 96 mg/dL (ref 70–99)
Potassium: 3.6 mmol/L (ref 3.5–5.1)
SODIUM: 138 mmol/L (ref 135–145)
TOTAL PROTEIN: 7.5 g/dL (ref 6.5–8.1)

## 2018-07-24 LAB — IRON AND TIBC
Iron: 14 ug/dL — ABNORMAL LOW (ref 28–170)
Saturation Ratios: 3 % — ABNORMAL LOW (ref 10.4–31.8)
TIBC: 437 ug/dL (ref 250–450)
UIBC: 423 ug/dL

## 2018-07-24 LAB — CBC
HCT: 24.8 % — ABNORMAL LOW (ref 36.0–46.0)
Hemoglobin: 6.7 g/dL — CL (ref 12.0–15.0)
MCH: 16.8 pg — AB (ref 26.0–34.0)
MCHC: 27 g/dL — AB (ref 30.0–36.0)
MCV: 62.2 fL — ABNORMAL LOW (ref 78.0–100.0)
Platelets: 281 10*3/uL (ref 150–400)
RBC: 3.99 MIL/uL (ref 3.87–5.11)
RDW: 20.3 % — AB (ref 11.5–15.5)
WBC: 7.6 10*3/uL (ref 4.0–10.5)

## 2018-07-24 LAB — URINALYSIS, ROUTINE W REFLEX MICROSCOPIC
BILIRUBIN URINE: NEGATIVE
Glucose, UA: NEGATIVE mg/dL
HGB URINE DIPSTICK: NEGATIVE
KETONES UR: NEGATIVE mg/dL
Leukocytes, UA: NEGATIVE
Nitrite: NEGATIVE
PROTEIN: NEGATIVE mg/dL
Specific Gravity, Urine: 1.014 (ref 1.005–1.030)
pH: 7 (ref 5.0–8.0)

## 2018-07-24 LAB — FERRITIN: Ferritin: 2 ng/mL — ABNORMAL LOW (ref 11–307)

## 2018-07-24 LAB — FOLATE: Folate: 10 ng/mL (ref >5.9)

## 2018-07-24 LAB — I-STAT BETA HCG BLOOD, ED (MC, WL, AP ONLY)

## 2018-07-24 LAB — RETICULOCYTES
RBC.: 3.92 MIL/uL (ref 3.87–5.11)
Retic Count, Absolute: 58.8 10*3/uL (ref 19.0–186.0)
Retic Ct Pct: 1.5 % (ref 0.4–3.1)

## 2018-07-24 LAB — LIPASE, BLOOD: Lipase: 26 U/L (ref 11–51)

## 2018-07-24 LAB — VITAMIN B12: Vitamin B-12: 241 pg/mL (ref 180–914)

## 2018-07-24 MED ORDER — GI COCKTAIL ~~LOC~~
30.0000 mL | Freq: Once | ORAL | Status: AC
  Administered 2018-07-24: 30 mL via ORAL
  Filled 2018-07-24: qty 30

## 2018-07-24 MED ORDER — IBUPROFEN 800 MG PO TABS: 800.0000 mg | ORAL_TABLET | Freq: Three times a day (TID) | ORAL | 0 refills | Status: DC | PRN

## 2018-07-24 MED ORDER — TRAMADOL HCL 50 MG PO TABS: 50.0000 mg | ORAL_TABLET | Freq: Four times a day (QID) | ORAL | 0 refills | Status: DC | PRN

## 2018-07-24 MED ORDER — FERROUS SULFATE 325 (65 FE) MG PO TABS: 325.0000 mg | ORAL_TABLET | Freq: Every day | ORAL | 0 refills | Status: DC

## 2018-07-24 NOTE — ED Provider Notes (Signed)
[facility] EMERG[facility] DEPARTMENT Provider Note   CSN: [id number] Arrival date & time: 07/24/18  0006     History   Chief Complaint Chief Complaint  Patient presents with  . Arm Pain  . Abdominal Pain    HPI Jenna Weaver is a 47 y.o. female.  HPI Patient presents to the emergency department with right shoulder pain that seems to radiate from time to time and her wrist and hand.  The patient states this started 5 days ago.  She states that nothing seems to make the condition better.  The patient states that certain movements and palpation make the pain worse.  Patient states that she did not take any medications prior to arrival.  The patient denies chest pain, shortness of breath, headache,blurred vision, neck pain, fever, cough, weakness, numbness, dizziness, anorexia, edema, abdominal pain, nausea, vomiting, diarrhea, rash, back pain, dysuria, hematemesis, bloody stool, near syncope, or syncope. History reviewed. No pertinent past medical history.  Patient Active Problem List   Diagnosis Date Noted  . BV (bacterial vaginosis) 03/23/2013  . Cervicitis 03/23/2013    Past Surgical History:  Procedure Laterality Date  . TUBAL LIGATION       OB History   None      Home Medications    Prior to Admission medications   Medication Sig Start Date End Date Taking? Authorizing Provider  Alum & Mag Hydroxide-Simeth (GI COCKTAIL) SUSP suspension Take 30 mLs by mouth 3 (three) times daily. Shake well. Patient not taking: Reported on 07/13/2015 07/11/13   Ignacia Felling, PA-C  clindamycin (CLEOCIN) 300 MG capsule Take 1 capsule (300 mg total) by mouth 3 (three) times daily. Patient not taking: Reported on 07/24/2018 07/30/17   Tanna Furry, MD  doxycycline (VIBRAMYCIN) 100 MG capsule Take 1 capsule (100 mg total) by mouth 2 (two) times daily. Patient not taking: Reported on 07/24/2018 07/30/17   Tanna Furry, MD  lidocaine (LIDODERM) 5 % Place 1 patch onto the  skin daily. Remove & Discard patch within 12 hours or as directed by MD Patient not taking: Reported on 07/24/2018 03/19/18   Maczis, Barth Kirks, PA-C  meloxicam (MOBIC) 15 MG tablet Take 1 tablet (15 mg total) by mouth daily. Patient not taking: Reported on 07/24/2018 03/19/18   Maczis, Barth Kirks, PA-C  methocarbamol (ROBAXIN) 500 MG tablet Take 1 tablet (500 mg total) by mouth at bedtime as needed for muscle spasms. Patient not taking: Reported on 07/24/2018 03/19/18   Jillyn Ledger, PA-C  pantoprazole (PROTONIX) 20 MG tablet Take 1 tablet (20 mg total) by mouth daily. Patient not taking: Reported on 11/12/2015 07/13/15   Nona Dell, PA-C  polyethylene glycol [facility]) packet Take 17 g by mouth daily. Patient not taking: Reported on 07/24/2018 11/12/15   Sherian Maroon, MD  predniSONE (STERAPRED UNI-PAK 21 TAB) 10 MG (21) TBPK tablet Take by mouth daily. Take 6 tabs by mouth daily  for 2 days, then 5 tabs for 2 days, then 4 tabs for 2 days, then 3 tabs for 2 days, 2 tabs for 2 days, then 1 tab by mouth daily for 2 days Patient not taking: Reported on 07/24/2018 03/19/18   Maczis, Barth Kirks, PA-C  ranitidine (ZANTAC) 150 MG tablet Take 1 tablet (150 mg total) by mouth 2 (two) times daily. Patient not taking: Reported on 07/13/2015 07/11/13   Ignacia Felling, PA-C    Family History History reviewed. No pertinent family history.  Social History Social History   Tobacco  Use  . Smoking status: Never Smoker  . Smokeless tobacco: Never Used  Substance Use Topics  . Alcohol use: No  . Drug use: No     Allergies   Codeine   Review of Systems Review of Systems  All other systems negative except as documented in the HPI. All pertinent positives and negatives as reviewed in the HPI. Physical Exam Updated Vital Signs BP (!) 163/88   Pulse 72   Temp 98.8 F (37.1 C) (Oral)   Resp 18   Ht 5' (1.524 m)   Wt 81.6 kg   LMP 06/29/2018   SpO2 100%   BMI 35.15 kg/m   Physical Exam    Constitutional: She is oriented to person, place, and time. She appears well-developed and well-nourished. No distress.  HENT:  Head: Normocephalic and atraumatic.  Mouth/Throat: Oropharynx is clear and moist.  Eyes: Pupils are equal, round, and reactive to light.  Neck: Normal range of motion. Neck supple.  Cardiovascular: Normal rate, regular rhythm and normal heart sounds. Exam reveals no gallop and no friction rub.  No murmur heard. Pulmonary/Chest: Effort normal and breath sounds normal. No respiratory distress. She has no wheezes.  Abdominal: Soft. Bowel sounds are normal. She exhibits no distension. There is no tenderness.  Musculoskeletal:       Arms: Neurological: She is alert and oriented to person, place, and time. She exhibits normal muscle tone. Coordination normal.  Skin: Skin is warm and dry. Capillary refill takes less than 2 seconds. No rash noted. No erythema.  Psychiatric: She has a normal mood and affect. Her behavior is normal.  Nursing note and vitals reviewed.    ED Treatments / Results  Labs (all labs ordered are listed, but only abnormal results are displayed) Labs Reviewed  COMPREHENSIVE METABOLIC PANEL - Abnormal; Notable for the following components:      Result Value   CO2 21 (*)    All other components within normal limits  CBC - Abnormal; Notable for the following components:   Hemoglobin 6.7 (*)    HCT 24.8 (*)    MCV 62.2 (*)    MCH 16.8 (*)    MCHC 27.0 (*)    RDW 20.3 (*)    All other components within normal limits  URINALYSIS, ROUTINE W REFLEX MICROSCOPIC - Abnormal; Notable for the following components:   Color, Urine STRAW (*)    All other components within normal limits  LIPASE, BLOOD  VITAMIN B12  FOLATE  IRON AND TIBC  FERRITIN  RETICULOCYTES  I-STAT BETA HCG BLOOD, ED ([facility], [facility], [facility] ONLY)    EKG None  Radiology Dg Shoulder Right  Result Date: 07/24/2018 CLINICAL DATA:  47 year old female with right shoulder pain. No  injury. EXAM: RIGHT SHOULDER - 2+ VIEW COMPARISON:  None. FINDINGS: There is no evidence of fracture or dislocation. There is no evidence of arthropathy or other focal bone abnormality. Soft tissues are unremarkable. IMPRESSION: Negative. Electronically Signed   By: Anner Crete M.D.   On: 07/24/2018 01:50    Procedures Procedures (including critical care time)  Medications Ordered in ED Medications  gi cocktail (Maalox,Lidocaine,Donnatal) (30 mLs Oral Given 07/24/18 3151)     Initial Impression / Assessment and Plan / ED Course  I have reviewed the triage vital signs and the nursing notes.  Pertinent labs & imaging results that were available during my care of the patient were reviewed by me and considered in my medical decision making (see chart for details).  Patient does have a hemoglobin of 6.7 but is totally asymptomatic.  Patient was questioned extensively about any weakness, lightheadedness, dizziness, shortness of breath, chest pain or syncope.  Patient states she has had none of the symptoms.  In reviewing her chart it does appear that she has been anemic in the past but not below 7.  Since the patient is asymptomatic I do feel that she could follow-up with a primary doctor after starting iron.  Patient will have an anemia panel drawn.  Patient has been stable here in the emergency department.  I do think that the hemoglobin of  6.7 is something that will need very close follow-up but since she states symptomatic at this time do not feel she needs to be admitted to the hospital given blood administration.  Patient is advised the plan and all questions were answered.  Final Clinical Impressions(s) / ED Diagnoses   Final diagnoses:  None    ED Discharge Orders    None       Dalia Heading, PA-C 07/24/18 1751    Rolland Porter, MD 07/24/18 [phone]

## 2018-07-24 NOTE — ED Triage Notes (Signed)
Pt here for right arm pain that starts in the shoulder and goes to the wrist.  Also nauseated and having stomach pains. A&Ox4

## 2018-07-24 NOTE — ED Notes (Signed)
ED Provider at bedside. 

## 2018-07-24 NOTE — Discharge Instructions (Signed)
He will need to follow-up with a primary doctor.  I am going to leave your information with our case management team who will try to find follow-up for you.  Return here for any worsening in your condition.

## 2019-05-16 ENCOUNTER — Ambulatory Visit (INDEPENDENT_AMBULATORY_CARE_PROVIDER_SITE_OTHER): Payer: Self-pay

## 2019-05-16 ENCOUNTER — Encounter (HOSPITAL_COMMUNITY): Payer: Self-pay

## 2019-05-16 ENCOUNTER — Other Ambulatory Visit: Payer: Self-pay

## 2019-05-16 ENCOUNTER — Ambulatory Visit (HOSPITAL_COMMUNITY)
Admission: EM | Admit: 2019-05-16 | Discharge: 2019-05-16 | Disposition: A | Discharge status: Home / Self Care | Payer: Self-pay | Attending: Emergency Medicine | Admitting: Emergency Medicine

## 2019-05-16 DIAGNOSIS — K219 Gastro-esophageal reflux disease without esophagitis: Secondary | ICD-10-CM | POA: Insufficient documentation

## 2019-05-16 DIAGNOSIS — K5900 Constipation, unspecified: Secondary | ICD-10-CM

## 2019-05-16 DIAGNOSIS — R1011 Right upper quadrant pain: Secondary | ICD-10-CM

## 2019-05-16 LAB — COMPREHENSIVE METABOLIC PANEL
ALT: 14 U/L (ref 0–44)
AST: 22 U/L (ref 15–41)
Albumin: 3.5 g/dL (ref 3.5–5.0)
Alkaline Phosphatase: 62 U/L (ref 38–126)
Anion gap: 8 (ref 5–15)
BUN: 5 mg/dL — ABNORMAL LOW (ref 6–20)
CO2: 25 mmol/L (ref 22–32)
Calcium: 9.3 mg/dL (ref 8.9–10.3)
Chloride: 102 mmol/L (ref 98–111)
Creatinine, Ser: 0.66 mg/dL (ref 0.44–1.00)
GFR calc Af Amer: >60 mL/min (ref >60)
GFR calc non Af Amer: >60 mL/min (ref >60)
Glucose, Bld: 88 mg/dL (ref 70–99)
Potassium: 3.6 mmol/L (ref 3.5–5.1)
Sodium: 135 mmol/L (ref 135–145)
Total Bilirubin: 0.3 mg/dL (ref 0.3–1.2)
Total Protein: 7.7 g/dL (ref 6.5–8.1)

## 2019-05-16 LAB — CBC
HCT: 30.4 % — ABNORMAL LOW (ref 36.0–46.0)
Hemoglobin: 9.1 g/dL — ABNORMAL LOW (ref 12.0–15.0)
MCH: 19.7 pg — ABNORMAL LOW (ref 26.0–34.0)
MCHC: 29.9 g/dL — ABNORMAL LOW (ref 30.0–36.0)
MCV: 65.8 fL — ABNORMAL LOW (ref 80.0–100.0)
Platelets: 270 10*3/uL (ref 150–400)
RBC: 4.62 MIL/uL (ref 3.87–5.11)
RDW: 20.8 % — ABNORMAL HIGH (ref 11.5–15.5)
WBC: 9 10*3/uL (ref 4.0–10.5)
nRBC: 0 % (ref 0.0–0.2)

## 2019-05-16 LAB — POCT URINALYSIS DIP (DEVICE)
Bilirubin Urine: NEGATIVE
Glucose, UA: NEGATIVE mg/dL
Hgb urine dipstick: NEGATIVE
Ketones, ur: NEGATIVE mg/dL
Leukocytes,Ua: NEGATIVE
Nitrite: NEGATIVE
Protein, ur: NEGATIVE mg/dL
Specific Gravity, Urine: 1.015 (ref 1.005–1.030)
Urobilinogen, UA: 0.2 mg/dL (ref 0.0–1.0)
pH: 7 (ref 5.0–8.0)

## 2019-05-16 LAB — LIPASE, BLOOD: Lipase: 27 U/L (ref 11–51)

## 2019-05-16 MED ORDER — ALUM & MAG HYDROXIDE-SIMETH 200-200-20 MG/5ML PO SUSP
30.0000 mL | Freq: Once | ORAL | Status: AC
  Administered 2019-05-16: 30 mL via ORAL

## 2019-05-16 MED ORDER — POLYETHYLENE GLYCOL 3350 17 GM/SCOOP PO POWD: 17.0000 g | Freq: Every day | ORAL | 0 refills | Status: DC

## 2019-05-16 MED ORDER — LIDOCAINE VISCOUS HCL 2 % MT SOLN
OROMUCOSAL | Status: AC
  Filled 2019-05-16: qty 15

## 2019-05-16 MED ORDER — LIDOCAINE VISCOUS HCL 2 % MT SOLN
15.0000 mL | Freq: Once | OROMUCOSAL | Status: AC
  Administered 2019-05-16: 15 mL via ORAL

## 2019-05-16 MED ORDER — OMEPRAZOLE 20 MG PO CPDR: 20.0000 mg | DELAYED_RELEASE_CAPSULE | Freq: Every day | ORAL | 0 refills | Status: DC

## 2019-05-16 MED ORDER — ALUM & MAG HYDROXIDE-SIMETH 200-200-20 MG/5ML PO SUSP
ORAL | Status: AC
  Filled 2019-05-16: qty 30

## 2019-05-16 NOTE — ED Triage Notes (Signed)
Patient presents to Urgent Care with complaints of right flank pain since a week. Patient reports the pain wakes her up in the middle of the night, denies urinary symptoms.

## 2019-05-16 NOTE — Discharge Instructions (Addendum)
You do have gas which is present to the right upper quadrant, which may be source of some of your discomfort.  I would recommend use of a daily stool softener- Dulcolax or Miralax power- to promote regular bowel movements.  Increase your water intake.  See provided information about diet and reflux as well.  Start daily omeprazole to help with your reflux.  Will notify you of any positive findings from your blood work and if any changes to treatment are needed.   Please establish with a primary care provider for recheck as needed for persistent symptoms.  Any worsening of symptoms- increased pain, fevers, vomiting, dehydration- or otherwise worsening please go to the ER.

## 2019-05-16 NOTE — ED Provider Notes (Signed)
[place]    CSN: [id number] Arrival date & time: 05/16/19  1515     History   Chief Complaint Chief Complaint  Patient presents with  . Flank Pain    Right    HPI Jenna Weaver is a 48 y.o. female.   Jenna Weaver presents with complaints of RUQ abdominal pain which does radiate around to back, as well as heartburn. This has been ongoing for approximately 1-2 weeks but has been worse over the past 3 nights. She took X-lax and had a BM, but feels it was still inadequate amount of stool. Last BM was two days ago. Last night the past was worse, took tylenol and BC which didn't help. Two nights ago felt nausea and vomited once. No nausea or vomiting since. Decrease oral intake as it seems to worsen pain when she eats. Used to take zantac but since recall has not replaced with anything, occasionally takes Tums which only sometimes help. No fevers or chills, no cough or congestion. She has had similar in the past and was recommended to use Miralax but states it is too expensive for her to use regularly. Denies urinary complaints. Doesn't follow with a PCP. Without contributing medical history.      ROS per HPI, negative if not otherwise mentioned.      History reviewed. No pertinent past medical history.  Patient Active Problem List   Diagnosis Date Noted  . BV (bacterial vaginosis) 03/23/2013  . Cervicitis 03/23/2013    Past Surgical History:  Procedure Laterality Date  . TUBAL LIGATION      OB History   No obstetric history on file.      Home Medications    Prior to Admission medications   Medication Sig Start Date End Date Taking? Authorizing Provider  ferrous sulfate 325 (65 FE) MG tablet Take 1 tablet (325 mg total) by mouth daily. 07/24/18   Lawyer, Harrell Gave, PA-C  ibuprofen (ADVIL,MOTRIN) 800 MG tablet Take 1 tablet (800 mg total) by mouth every 8 (eight) hours as needed. 07/24/18   Lawyer, Harrell Gave, PA-C  omeprazole (PRILOSEC) 20 MG  capsule Take 1 capsule (20 mg total) by mouth daily. 05/16/19   Augusto Gamble B, NP  polyethylene glycol powder (GLYCOLAX/MIRALAX) 17 GM/SCOOP powder Take 17 g by mouth daily. 05/16/19   Zigmund Gottron, NP  pantoprazole (PROTONIX) 20 MG tablet Take 1 tablet (20 mg total) by mouth daily. Patient not taking: Reported on 11/12/2015 07/13/15 05/16/19  Nona Dell, PA-C  ranitidine (ZANTAC) 150 MG tablet Take 1 tablet (150 mg total) by mouth 2 (two) times daily. Patient not taking: Reported on 07/13/2015 07/11/13 05/16/19  Ignacia Felling, PA-C    Family History Family History  Problem Relation Age of Onset  . Hypertension Mother   . Diabetes Mother   . Healthy Father     Social History Social History   Tobacco Use  . Smoking status: Never Smoker  . Smokeless tobacco: Never Used  Substance Use Topics  . Alcohol use: No  . Drug use: No     Allergies   Codeine   Review of Systems Review of Systems   Physical Exam Triage Vital Signs ED Triage Vitals  Enc Vitals Group     BP 05/16/19 1536 137/87     Pulse Rate 05/16/19 1536 75     Resp 05/16/19 1536 17     Temp 05/16/19 1536 98.4 F (36.9 C)     Temp Source 05/16/19 1536  Oral     SpO2 05/16/19 1536 100 %     Weight --      Height --      Head Circumference --      Peak Flow --      Pain Score 05/16/19 1533 10     Pain Loc --      Pain Edu? --      Excl. in [place]? --    No data found.  Updated Vital Signs BP 137/87 (BP Location: Left Arm)   Pulse 75   Temp 98.4 F (36.9 C) (Oral)   Resp 17   LMP 05/02/2019 Comment: pt have tubes tied  SpO2 100%   Physical Exam Constitutional:      General: She is not in acute distress.    Appearance: She is well-developed.  Cardiovascular:     Rate and Rhythm: Normal rate.  Pulmonary:     Effort: Pulmonary effort is normal.  Abdominal:     Palpations: Abdomen is soft.     Tenderness: There is abdominal tenderness in the right upper quadrant. There is no right  CVA tenderness, left CVA tenderness, guarding or rebound. Negative signs include Murphy's sign and Rovsing's sign.     Comments: Right sided abdominal pain on palpation, primarily RUQ  Skin:    General: Skin is warm and dry.  Neurological:     Mental Status: She is alert and oriented to person, place, and time.      UC Treatments / Results  Labs (all labs ordered are listed, but only abnormal results are displayed) Labs Reviewed  CBC  COMPREHENSIVE METABOLIC PANEL  LIPASE, BLOOD  POCT URINALYSIS DIP (DEVICE)    EKG   Radiology Dg Abd 2 Views  Result Date: 05/16/2019 CLINICAL DATA:  Right flank pain EXAM: ABDOMEN - 2 VIEW COMPARISON:  11/12/2015 FINDINGS: Rounded rim calcified structure noted in the left lower abdomen which likely reflects calcified fibroid. Nonobstructive bowel gas pattern. No organomegaly or free air. No suspicious calcification. IMPRESSION: No evidence of bowel obstruction or free air. Electronically Signed   By: Rolm Baptise M.D.   On: 05/16/2019 17:00    Procedures Procedures (including critical care time)  Medications Ordered in UC Medications  alum & mag hydroxide-simeth (MAALOX/MYLANTA) 200-200-20 MG/5ML suspension 30 mL (30 mLs Oral Given 05/16/19 1629)    And  lidocaine (XYLOCAINE) 2 % viscous mouth solution 15 mL (15 mLs Oral Given 05/16/19 1630)  alum & mag hydroxide-simeth (MAALOX/MYLANTA) 200-200-20 MG/5ML suspension (has no administration in time range)  lidocaine (XYLOCAINE) 2 % viscous mouth solution (has no administration in time range)    Initial Impression / Assessment and Plan / UC Course  I have reviewed the triage vital signs and the nursing notes.  Pertinent labs & imaging results that were available during my care of the patient were reviewed by me and considered in my medical decision making (see chart for details).     gerd symptoms as well as RUQ pain. Normal UA. Basic labs and lipase collected and pending. Will notify of any  positive findings and if any changes to treatment are needed.  Gi cocktail provided in clinic with improvement. Will start a daily omeprazole for prevention.abd films without indication of obstruction. Constipation treatment discussed. Return precautions provided. Patient verbalized understanding and agreeable to plan.   Final Clinical Impressions(s) / UC Diagnoses   Final diagnoses:  RUQ pain  Gastroesophageal reflux disease, esophagitis presence not specified  Constipation, unspecified constipation type  Discharge Instructions     You do have gas which is present to the right upper quadrant, which may be source of some of your discomfort.  I would recommend use of a daily stool softener- Dulcolax or Miralax power- to promote regular bowel movements.  Increase your water intake.  See provided information about diet and reflux as well.  Start daily omeprazole to help with your reflux.  Will notify you of any positive findings from your blood work and if any changes to treatment are needed.   Please establish with a primary care provider for recheck as needed for persistent symptoms.  Any worsening of symptoms- increased pain, fevers, vomiting, dehydration- or otherwise worsening please go to the ER.     ED Prescriptions    Medication Sig Dispense Auth. Provider   omeprazole (PRILOSEC) 20 MG capsule Take 1 capsule (20 mg total) by mouth daily. 30 capsule Augusto Gamble B, NP   polyethylene glycol powder (GLYCOLAX/MIRALAX) 17 GM/SCOOP powder Take 17 g by mouth daily. 255 g Zigmund Gottron, NP     Controlled Substance Prescriptions [place] Controlled Substance Registry consulted? Not Applicable   Zigmund Gottron, NP 05/16/19 1726

## 2019-05-17 ENCOUNTER — Telehealth (HOSPITAL_COMMUNITY): Payer: Self-pay | Admitting: Emergency Medicine

## 2019-05-17 NOTE — Telephone Encounter (Signed)
Pt contacted about blood work, will continue iron and will contact her PCP if needed.

## 2019-07-02 ENCOUNTER — Other Ambulatory Visit: Payer: Self-pay

## 2019-07-02 ENCOUNTER — Ambulatory Visit
Admission: RE | Admit: 2019-07-02 | Discharge: 2019-07-02 | Disposition: A | Discharge status: Home / Self Care | Payer: No Typology Code available for payment source | Source: Ambulatory Visit | Attending: Internal Medicine | Admitting: Internal Medicine

## 2019-07-02 ENCOUNTER — Other Ambulatory Visit: Payer: Self-pay | Admitting: Internal Medicine

## 2019-07-02 DIAGNOSIS — Z111 Encounter for screening for respiratory tuberculosis: Secondary | ICD-10-CM

## 2019-08-06 ENCOUNTER — Encounter (HOSPITAL_COMMUNITY): Payer: Self-pay | Admitting: Emergency Medicine

## 2019-08-06 ENCOUNTER — Emergency Department (HOSPITAL_COMMUNITY): Payer: Self-pay

## 2019-08-06 ENCOUNTER — Emergency Department (HOSPITAL_COMMUNITY)
Admission: EM | Admit: 2019-08-06 | Discharge: 2019-08-06 | Disposition: A | Discharge status: Home / Self Care | Payer: Self-pay | Attending: Emergency Medicine | Admitting: Emergency Medicine

## 2019-08-06 DIAGNOSIS — I10 Essential (primary) hypertension: Secondary | ICD-10-CM | POA: Insufficient documentation

## 2019-08-06 DIAGNOSIS — E876 Hypokalemia: Secondary | ICD-10-CM | POA: Insufficient documentation

## 2019-08-06 DIAGNOSIS — Z79899 Other long term (current) drug therapy: Secondary | ICD-10-CM | POA: Insufficient documentation

## 2019-08-06 DIAGNOSIS — R1031 Right lower quadrant pain: Secondary | ICD-10-CM | POA: Insufficient documentation

## 2019-08-06 DIAGNOSIS — R1011 Right upper quadrant pain: Secondary | ICD-10-CM | POA: Insufficient documentation

## 2019-08-06 DIAGNOSIS — D509 Iron deficiency anemia, unspecified: Secondary | ICD-10-CM | POA: Insufficient documentation

## 2019-08-06 LAB — COMPREHENSIVE METABOLIC PANEL
ALT: 11 U/L (ref 0–44)
AST: 20 U/L (ref 15–41)
Albumin: 3.7 g/dL (ref 3.5–5.0)
Alkaline Phosphatase: 61 U/L (ref 38–126)
Anion gap: 14 (ref 5–15)
BUN: 7 mg/dL (ref 6–20)
CO2: 22 mmol/L (ref 22–32)
Calcium: 9.1 mg/dL (ref 8.9–10.3)
Chloride: 100 mmol/L (ref 98–111)
Creatinine, Ser: 0.77 mg/dL (ref 0.44–1.00)
GFR calc Af Amer: >60 mL/min (ref >60)
GFR calc non Af Amer: >60 mL/min (ref >60)
Glucose, Bld: 93 mg/dL (ref 70–99)
Potassium: 3 mmol/L — ABNORMAL LOW (ref 3.5–5.1)
Sodium: 136 mmol/L (ref 135–145)
Total Bilirubin: 0.5 mg/dL (ref 0.3–1.2)
Total Protein: 8.1 g/dL (ref 6.5–8.1)

## 2019-08-06 LAB — URINALYSIS, ROUTINE W REFLEX MICROSCOPIC
Bilirubin Urine: NEGATIVE
Glucose, UA: NEGATIVE mg/dL
Hgb urine dipstick: NEGATIVE
Ketones, ur: NEGATIVE mg/dL
Leukocytes,Ua: NEGATIVE
Nitrite: NEGATIVE
Protein, ur: 30 mg/dL — AB
Specific Gravity, Urine: 1.026 (ref 1.005–1.030)
pH: 5 (ref 5.0–8.0)

## 2019-08-06 LAB — CBC
HCT: 33.6 % — ABNORMAL LOW (ref 36.0–46.0)
Hemoglobin: 9.6 g/dL — ABNORMAL LOW (ref 12.0–15.0)
MCH: 19.9 pg — ABNORMAL LOW (ref 26.0–34.0)
MCHC: 28.6 g/dL — ABNORMAL LOW (ref 30.0–36.0)
MCV: 69.6 fL — ABNORMAL LOW (ref 80.0–100.0)
Platelets: 365 10*3/uL (ref 150–400)
RBC: 4.83 MIL/uL (ref 3.87–5.11)
RDW: 19.9 % — ABNORMAL HIGH (ref 11.5–15.5)
WBC: 7.1 10*3/uL (ref 4.0–10.5)
nRBC: 0 % (ref 0.0–0.2)

## 2019-08-06 LAB — LIPASE, BLOOD: Lipase: 19 U/L (ref 11–51)

## 2019-08-06 LAB — I-STAT BETA HCG BLOOD, ED (MC, WL, AP ONLY): I-stat hCG, quantitative: <5 m[IU]/mL (ref <5)

## 2019-08-06 MED ORDER — PANTOPRAZOLE SODIUM 40 MG PO TBEC: 40.0000 mg | DELAYED_RELEASE_TABLET | Freq: Every day | ORAL | 1 refills | Status: DC

## 2019-08-06 MED ORDER — SODIUM CHLORIDE 0.9% FLUSH: 3.0000 mL | Freq: Once | INTRAVENOUS | Status: DC

## 2019-08-06 NOTE — ED Provider Notes (Signed)
[facility]PART[facility] Provider Note   CSN: [id number] Arrival date & time: 08/06/19  0734     History   Chief Complaint Chief Complaint  Patient presents with  . Gastroesophageal Reflux    HPI Jenna Weaver is a 48 y.o. female.     HPI  Jenna Weaver presents for evaluation of pain in upper chest, which feels like reflux and intermittent right upper quadrant pain that radiates to her right flank, mostly at nighttime for several days.  Jenna Weaver denies fever, chills, vomiting, dizziness, weakness or paresthesia.  Jenna Weaver has been using Tums with partial relief of the discomfort.  Jenna Weaver has previously taken the PPI but is not currently doing that.  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  No known sick contacts.  There are no other known modifying factors.   History reviewed. No pertinent past medical history.  Patient Active Problem List   Diagnosis Date Noted  . BV (bacterial vaginosis) 03/23/2013  . Cervicitis 03/23/2013    Past Surgical History:  Procedure Laterality Date  . TUBAL LIGATION       OB History   No obstetric history on file.      Home Medications    Prior to Admission medications   Medication Sig Start Date End Date Taking? Authorizing Provider  ferrous sulfate 325 (65 FE) MG tablet Take 1 tablet (325 mg total) by mouth daily. 07/24/18  Yes Lawyer, Harrell Gave, PA-C  ibuprofen (ADVIL) 200 MG tablet Take 200 mg by mouth every 6 (six) hours as needed for fever, headache or mild pain.   Yes [provider]  isoniazid (NYDRAZID) 300 MG tablet Take 900 mg by mouth every Thursday.   Yes [provider]  pyridOXINE (VITAMIN B-6) 50 MG tablet Take 50 mg by mouth every Thursday.   Yes [provider]  Rifapentine (PRIFTIN) 150 MG TABS Take 900 mg by mouth every Thursday.   Yes [provider]  omeprazole (PRILOSEC) 20 MG capsule Take 1 capsule (20 mg total) by mouth daily. Patient not taking: Reported on 08/06/2019  05/16/19   Augusto Gamble B, NP  pantoprazole (PROTONIX) 40 MG tablet Take 1 tablet (40 mg total) by mouth daily. 08/06/19   Daleen Bo, MD  polyethylene glycol powder (GLYCOLAX/MIRALAX) 17 GM/SCOOP powder Take 17 g by mouth daily. Patient not taking: Reported on 08/06/2019 05/16/19   Zigmund Gottron, NP  ranitidine (ZANTAC) 150 MG tablet Take 1 tablet (150 mg total) by mouth 2 (two) times daily. Patient not taking: Reported on 07/13/2015 07/11/13 05/16/19  Ignacia Felling, PA-C    Family History Family History  Problem Relation Age of Onset  . Hypertension Mother   . Diabetes Mother   . Healthy Father     Social History Social History   Tobacco Use  . Smoking status: Never Smoker  . Smokeless tobacco: Never Used  Substance Use Topics  . Alcohol use: No  . Drug use: No     Allergies   Codeine   Review of Systems Review of Systems  All other systems reviewed and are negative.    Physical Exam Updated Vital Signs BP 133/72   Pulse 67   Temp 98.5 F (36.9 C) (Oral)   Resp 14   SpO2 100%   Physical Exam Vitals signs and nursing note reviewed.  Constitutional:      General: Jenna Weaver is not in acute distress.    Appearance: Jenna Weaver is well-developed. Jenna Weaver is obese. Jenna Weaver is not  ill-appearing, toxic-appearing or diaphoretic.  HENT:     Head: Normocephalic and atraumatic.     Right Ear: External ear normal.     Left Ear: External ear normal.  Eyes:     Conjunctiva/sclera: Conjunctivae normal.     Pupils: Pupils are equal, round, and reactive to light.  Neck:     Musculoskeletal: Normal range of motion and neck supple.     Trachea: Phonation normal.  Cardiovascular:     Rate and Rhythm: Normal rate and regular rhythm.     Heart sounds: Normal heart sounds.  Pulmonary:     Effort: Pulmonary effort is normal. No respiratory distress.     Breath sounds: Normal breath sounds.  Abdominal:     General: There is no distension.     Palpations: Abdomen is soft. There is no  mass.     Tenderness: There is no abdominal tenderness.     Hernia: No hernia is present.  Musculoskeletal: Normal range of motion.  Skin:    General: Skin is warm and dry.  Neurological:     Mental Status: Jenna Weaver is alert and oriented to person, place, and time.     Cranial Nerves: No cranial nerve deficit.     Sensory: No sensory deficit.     Motor: No abnormal muscle tone.     Coordination: Coordination normal.  Psychiatric:        Mood and Affect: Mood normal.        Behavior: Behavior normal.        Thought Content: Thought content normal.        Judgment: Judgment normal.      ED Treatments / Results  Labs (all labs ordered are listed, but only abnormal results are displayed) Labs Reviewed  COMPREHENSIVE METABOLIC PANEL - Abnormal; Notable for the following components:      Result Value   Potassium 3.0 (*)    All other components within normal limits  CBC - Abnormal; Notable for the following components:   Hemoglobin 9.6 (*)    HCT 33.6 (*)    MCV 69.6 (*)    MCH 19.9 (*)    MCHC 28.6 (*)    RDW 19.9 (*)    All other components within normal limits  URINALYSIS, ROUTINE W REFLEX MICROSCOPIC - Abnormal; Notable for the following components:   APPearance HAZY (*)    Protein, ur 30 (*)    Bacteria, UA RARE (*)    All other components within normal limits  LIPASE, BLOOD  I-STAT BETA HCG BLOOD, ED ([facility], [facility], [facility] ONLY)    EKG EKG Interpretation  Date/Time:  Tuesday August 06 2019 08:23:09 EDT Ventricular Rate:  68 PR Interval:  122 QRS Duration: 86 QT Interval:  398 QTC Calculation: 423 R Axis:   40 Text Interpretation:  Normal sinus rhythm Normal ECG since last tracing no significant change Confirmed by Daleen Bo [phone]) on 08/06/2019 9:07:43 AM   Radiology US Abdomen Complete  Result Date: 08/06/2019 CLINICAL DATA:  Right upper quadrant pain. EXAM: ABDOMEN ULTRASOUND COMPLETE COMPARISON:  05/16/2019, ultrasound of the abdomen. FINDINGS: Gallbladder: No  gallstones or wall thickening visualized. No sonographic Murphy sign noted by sonographer. Common bile duct: Diameter: 3 mm Liver: 1 cm cyst, inferior right lobe. Liver normal in size and overall echogenicity. No other masses or lesions. Portal vein is patent on color Doppler imaging with normal direction of blood flow towards the liver. IVC: Not visualized below the liver due to midline bowel gas.  Pancreas: Not visualized due to midline bowel gas. Spleen: Size and appearance within normal limits. Right Kidney: Length: 10.6 cm. Normal parenchymal echogenicity. Upper pole cyst, 1.7 cm. Midpole cyst, 1.7 cm. Additional midpole cyst, 9 mm. No other masses, no stones and no hydronephrosis. Left Kidney: Length: 10.4 cm. Normal parenchymal echogenicity. Upper pole 12 mm cyst. Additional 13 mm cyst. No other masses, no stones and no hydronephrosis. Abdominal aorta: Normal caliber proximally. Mid to distal portion obscured by bowel gas. Other findings: None. IMPRESSION: 1. No acute findings. Normal gallbladder. No findings to account for right upper quadrant abdominal pain. 2. Small liver cysts.  Small bilateral renal cysts. 3. Midline structures obscured by overlying bowel gas. Electronically Signed   By: Lajean Manes M.D.   On: 08/06/2019 11:25    Procedures Procedures (including critical care time)  Medications Ordered in ED Medications  sodium chloride flush (NS) 0.9 % injection 3 mL (has no administration in time range)     Initial Impression / Assessment and Plan / ED Course  I have reviewed the triage vital signs and the nursing notes.  Pertinent labs & imaging results that were available during my care of the patient were reviewed by me and considered in my medical decision making (see chart for details).  Clinical Course as of Aug 05 1313  Tue Aug 06, 2019  0919 Normal  I-Stat beta hCG blood, ED [EW]  0919 Normal  Lipase, blood [EW]  0919 Normal except hemoglobin low, MCV low  CBC(!) [EW]   0920 Normal except potassium low  Comprehensive metabolic panel(!) [EW]  [id number] Elevated  BP(!): 152/81 [EW]  1302 No acute abnormality per radiologist.  US Abdomen Complete [EW]  1302 Normal  Urinalysis, Routine w reflex microscopic(!) [EW]    Clinical Course User Index [EW] Daleen Bo, MD        Patient Vitals for the past 24 hrs:  BP Temp Temp src Pulse Resp SpO2  08/06/19 1000 133/72 - - 67 - 100 %  08/06/19 0930 137/64 - - 72 - 100 %  08/06/19 0738 (!) 152/81 98.5 F (36.9 C) Oral 76 14 100 %    1:07 PM Reevaluation with update and discussion. After initial assessment and treatment, an updated evaluation reveals Jenna Weaver appears comfortable.  Findings discussed with her.  Jenna Weaver states that Jenna Weaver is currently taking iron pills for iron deficiency anemia.  Jenna Weaver has a primary care doctor for follow-up by her report.  Findings discussed, and questions answered. Daleen Bo   Medical Decision Making: Nonspecific abdominal pain possibly related to peptic ulcer disease.  Incidental anemia which is stable.  Jenna Weaver appears to have iron deficiency.  Also noted is incidental hypokalemia.  Jenna Weaver is not having vomiting and is not on diuretics.  No indication for supplementation at this time.  No serious bacterial infection, metabolic instability or impending vascular collapse.  No evidence for gallbladder disease.  No evidence for renal disease.  Doubt UTI.  Jenna Weaver is stable for discharge.  CRITICAL CARE-no Performed by: Daleen Bo  Nursing Notes Reviewed/ Care Coordinated Applicable Imaging Reviewed Interpretation of Laboratory Data incorporated into ED treatment  The patient appears reasonably screened and/or stabilized for discharge and I doubt any other medical condition or other [facility] requiring further screening, evaluation, or treatment in the ED at this time prior to discharge.  Plan: Home Medications-OTC antacid of choice, restart PPI; Home Treatments-fluids and gradually increase diet,  concentrating on potassium supplementation; return here if the recommended treatment, does  not improve the symptoms; Recommended follow up-PCP follow-up 1 week and as needed   Final Clinical Impressions(s) / ED Diagnoses   Final diagnoses:  Right upper quadrant abdominal pain  Iron deficiency anemia, unspecified iron deficiency anemia type  Hypokalemia  Hypertension, unspecified type    ED Discharge Orders         Ordered    pantoprazole (PROTONIX) 40 MG tablet  Daily     08/06/19 1313           Daleen Bo, MD 08/06/19 2011

## 2019-08-06 NOTE — Discharge Instructions (Addendum)
The testing today indicates that you likely have Peptic Ulcer disease causing your symptoms.  We are giving a prescription for a PPI, to reduce your acid secretion.  Is also helpful to use a solid antacid like Tums, or liquid antacid like Maalox, before meals and at bedtime.  Take the antiacid for a couple of weeks until the PPI starts working.  You continue to have a low blood count with iron deficiency.  Continue taking iron pills once or twice a day, for now.  Your potassium level was slightly low, it will help to eat foods which contain more potassium  to bring that up.  Please see the attached information for help with that.  See the attached resource guide to help you find a primary care doctor for a follow-up appointment in 1 or 2 weeks.

## 2019-08-06 NOTE — ED Triage Notes (Signed)
Pt states since Saturday she has been having acid reflux a feeling of food coming back up her throat and belching with foul odor per patient. Pt states yesterday she began to have emesis of what felt like acid coming up her throat. Pt also having RUQ pain that goes into her back.

## 2019-08-06 NOTE — ED Notes (Signed)
Unable to locate pt shoes after she was moved to hallway. Will check with previous RN.

## 2019-08-06 NOTE — ED Notes (Signed)
Pt shoes were located. Patient verbalizes understanding of discharge instructions. Opportunity for questioning and answers were provided. Armband removed by staff, pt discharged from ED.

## 2019-08-06 NOTE — ED Notes (Signed)
Patient transported to Ultrasound 

## 2019-08-14 ENCOUNTER — Other Ambulatory Visit: Payer: Self-pay

## 2019-08-14 DIAGNOSIS — Z20822 Contact with and (suspected) exposure to covid-19: Secondary | ICD-10-CM

## 2019-08-15 LAB — NOVEL CORONAVIRUS, NAA: SARS-CoV-2, NAA: NOT DETECTED

## 2020-01-20 IMAGING — US US ABDOMEN COMPLETE
1 series · 13 of 25 positions shown · non-contrast
Comparison: 05/16/2019, ultrasound of the abdomen.

CLINICAL DATA: Right upper quadrant pain.

EXAM:
ABDOMEN ULTRASOUND COMPLETE

[Series 1: us abdomen complete · 13 of 116 slices shown]
[im 1/116]
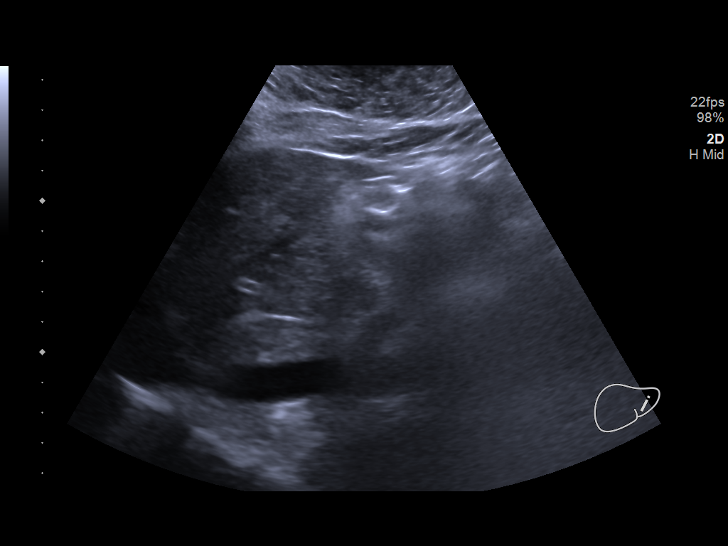
[im 10/116]
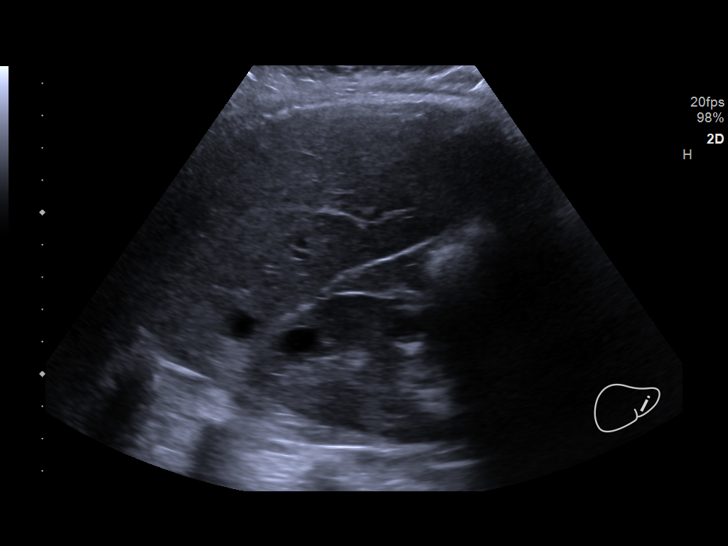
[im 20/116]
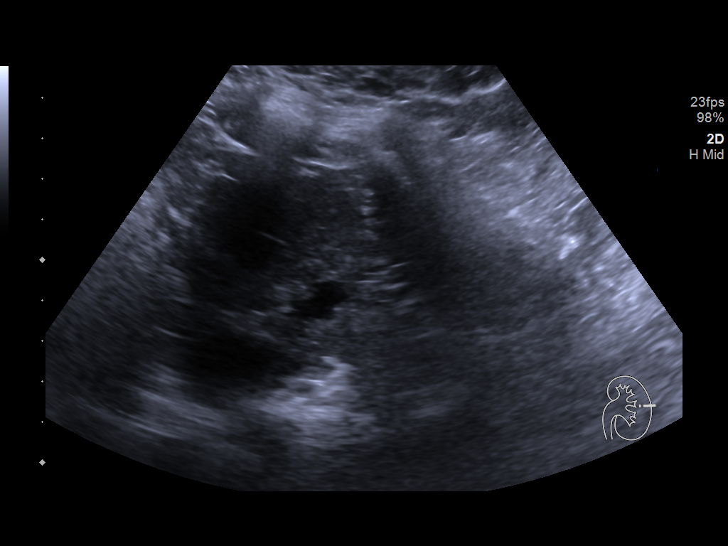
[im 29/116]
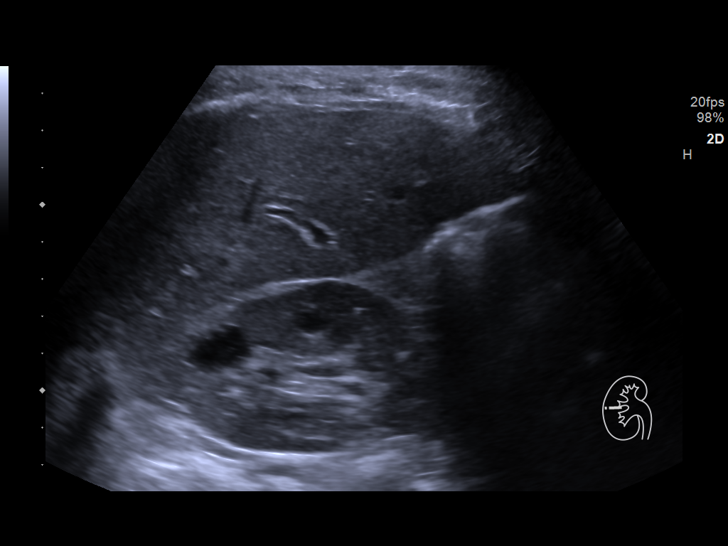
[im 39/116]
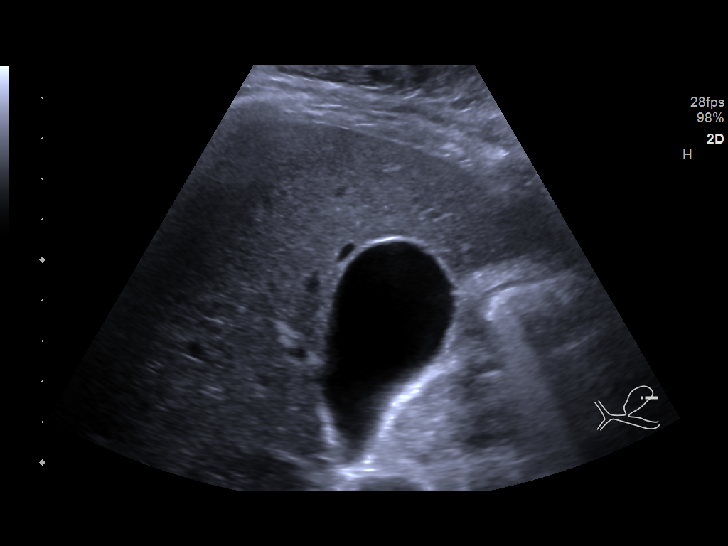
[im 48/116]
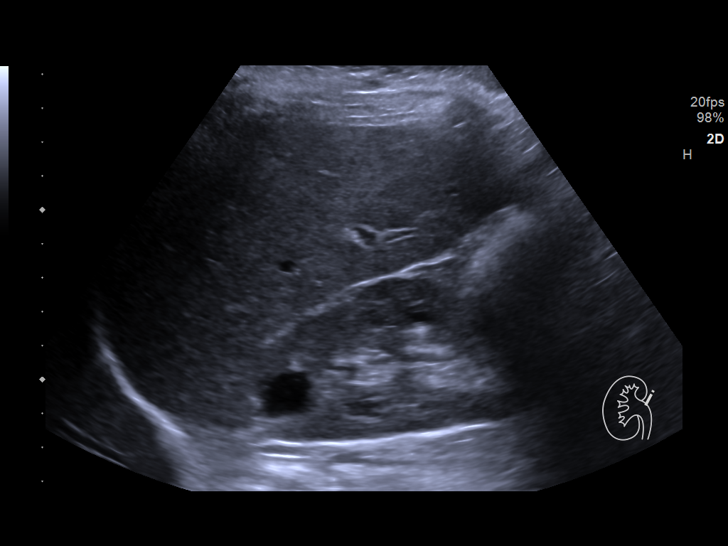
[im 58/116]
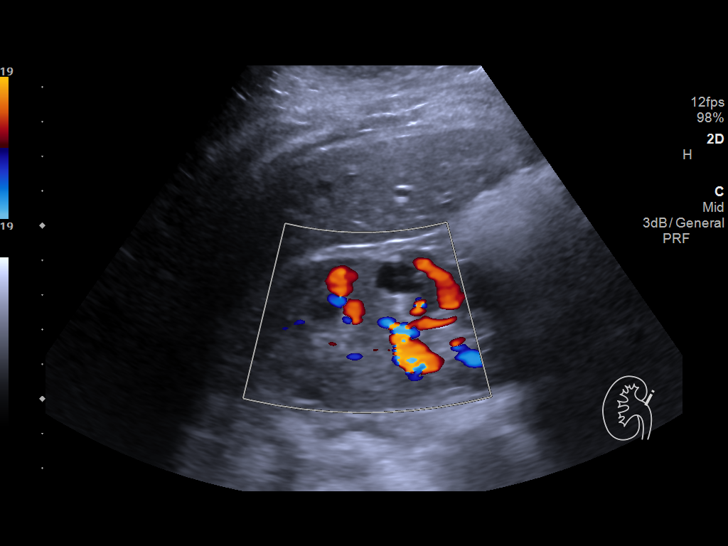
[im 68/116]
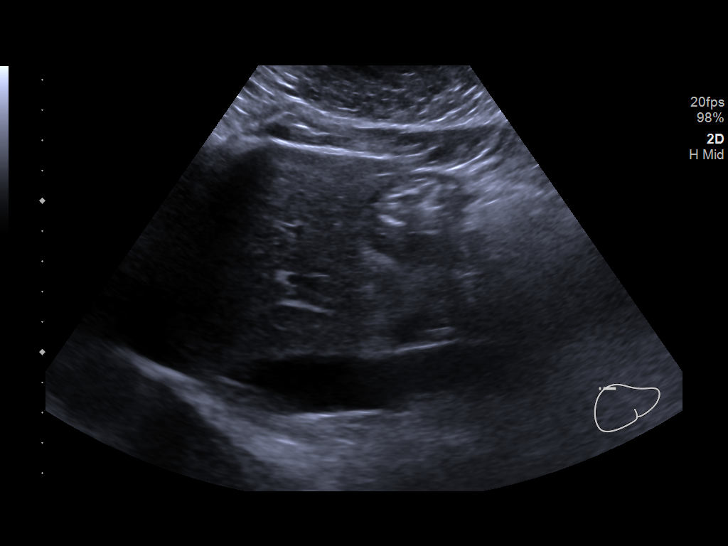
[im 77/116]
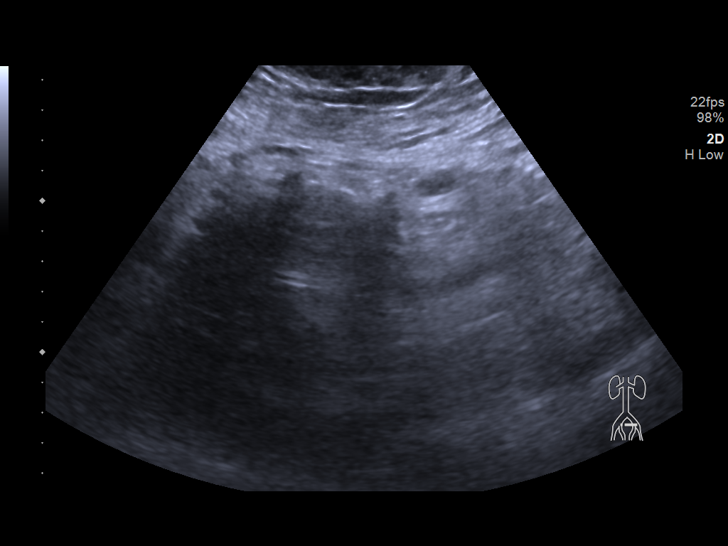
[im 87/116]
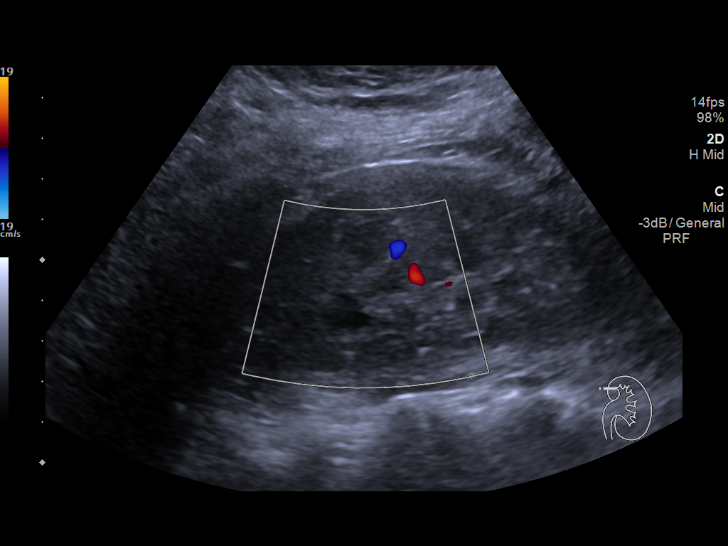
[im 96/116]
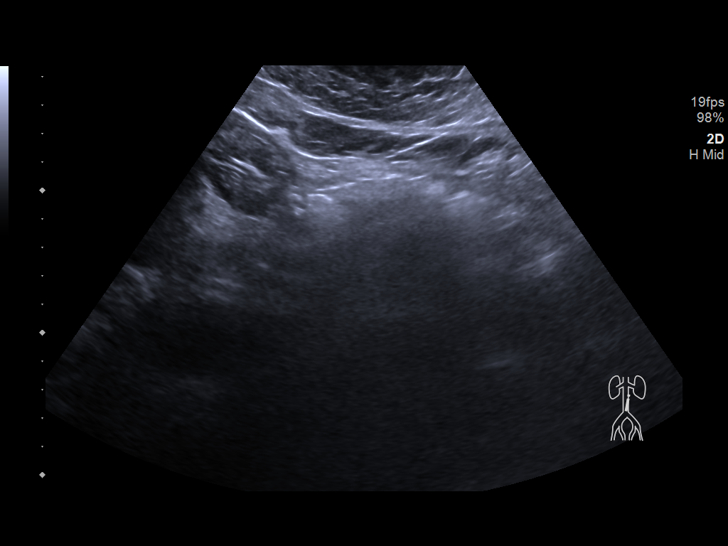
[im 106/116]
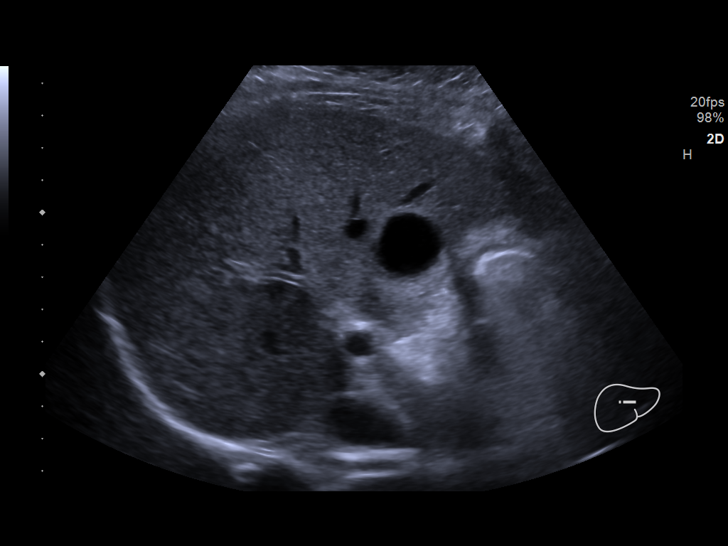
[im 116/116]
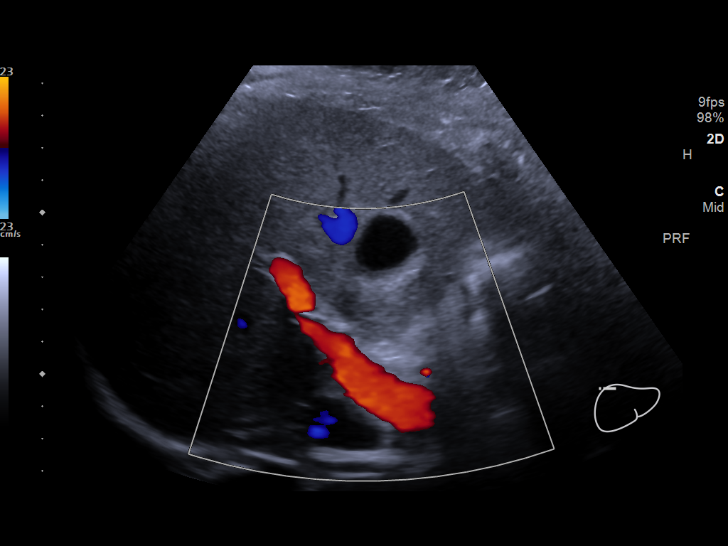

[13 of 25 positions shown; findings below may reference images not displayed]

FINDINGS: Gallbladder: No gallstones or wall thickening visualized. No
sonographic Murphy sign noted by sonographer.

Common bile duct: Diameter: 3 mm

Liver: 1 cm cyst, inferior right lobe. Liver normal in size and
overall echogenicity. No other masses or lesions. Portal vein is
patent on color Doppler imaging with normal direction of blood flow
towards the liver.

IVC: Not visualized below the liver due to midline bowel gas.

Pancreas: Not visualized due to midline bowel gas.

Spleen: Size and appearance within normal limits.

Right Kidney: Length: 10.6 cm. Normal parenchymal echogenicity.
Upper pole cyst, 1.7 cm. Midpole cyst, 1.7 cm. Additional midpole
cyst, 9 mm. No other masses, no stones and no hydronephrosis.

Left Kidney: Length: 10.4 cm. Normal parenchymal echogenicity. Upper
pole 12 mm cyst. Additional 13 mm cyst. No other masses, no stones
and no hydronephrosis.

Abdominal aorta: Normal caliber proximally. Mid to distal portion
obscured by bowel gas.

Other findings: None.
IMPRESSION: 1. No acute findings. Normal gallbladder. No findings to account for
right upper quadrant abdominal pain.
2. Small liver cysts.  Small bilateral renal cysts.
3. Midline structures obscured by overlying bowel gas.

## 2020-11-17 ENCOUNTER — Encounter (HOSPITAL_COMMUNITY): Payer: Self-pay

## 2020-11-17 ENCOUNTER — Other Ambulatory Visit: Payer: Self-pay

## 2020-11-17 ENCOUNTER — Ambulatory Visit (HOSPITAL_COMMUNITY)
Admission: EM | Admit: 2020-11-17 | Discharge: 2020-11-17 | Disposition: A | Discharge status: Home / Self Care | Payer: Self-pay | Attending: Urgent Care | Admitting: Urgent Care

## 2020-11-17 DIAGNOSIS — R1030 Lower abdominal pain, unspecified: Secondary | ICD-10-CM | POA: Insufficient documentation

## 2020-11-17 DIAGNOSIS — K5900 Constipation, unspecified: Secondary | ICD-10-CM | POA: Insufficient documentation

## 2020-11-17 DIAGNOSIS — R350 Frequency of micturition: Secondary | ICD-10-CM | POA: Insufficient documentation

## 2020-11-17 LAB — POCT URINALYSIS DIPSTICK, ED / UC
Bilirubin Urine: NEGATIVE
Glucose, UA: NEGATIVE mg/dL
Hgb urine dipstick: NEGATIVE
Ketones, ur: NEGATIVE mg/dL
Leukocytes,Ua: NEGATIVE
Nitrite: NEGATIVE
Protein, ur: NEGATIVE mg/dL
Specific Gravity, Urine: 1.015 (ref 1.005–1.030)
Urobilinogen, UA: 0.2 mg/dL (ref 0.0–1.0)
pH: 6 (ref 5.0–8.0)

## 2020-11-17 LAB — POC URINE PREG, ED: Preg Test, Ur: NEGATIVE

## 2020-11-17 MED ORDER — IBUPROFEN 100 MG/5ML PO SUSP: 600.0000 mg | Freq: Three times a day (TID) | ORAL | 0 refills | Status: DC | PRN

## 2020-11-17 MED ORDER — POLYETHYLENE GLYCOL 3350 17 G PO PACK: 17.0000 g | PACK | Freq: Every day | ORAL | 0 refills | Status: DC

## 2020-11-17 NOTE — ED Provider Notes (Signed)
[place]   MRN: [id number] DOB: [birth year]  Subjective:   Jenna Weaver is a 50 y.o. female presenting for 3-day history of acute onset urinary frequency, mid to left-sided abdominal pain.  Patient feels a lot of pressure as the pain.  Denies fever, nausea, vomiting but has had decreased appetite.  No dysuria, hematuria, vaginal discharge.  Patient is not concerned for any STI.  She does have a longstanding history of constipation since she was a child.  Admits that she has not had a complete bowel movement in the past 5 days.  She tried to use a laxative yesterday but still did not have a complete bowel movement, states that she had small round stool that was hard and she had to strain.  Denies history of colon issues, Crohn's disease, diverticulitis.  No bloody stools.  Regarding her urinary symptoms, patient admits that she tries to hydrate with water but also drinks 4 bottles of green tea every day.  She limits alcohol use, sodas, coffee.  No current facility-administered medications for this encounter.  Current Outpatient Medications:  .  ferrous sulfate 325 (65 FE) MG tablet, Take 1 tablet (325 mg total) by mouth daily., Disp: 30 tablet, Rfl: 0 .  ibuprofen (ADVIL) 200 MG tablet, Take 200 mg by mouth every 6 (six) hours as needed for fever, headache or mild pain., Disp: , Rfl:    Allergies  Allergen Reactions  . Codeine Hives    History reviewed. No pertinent past medical history.   Past Surgical History:  Procedure Laterality Date  . TUBAL LIGATION      Family History  Problem Relation Age of Onset  . Hypertension Mother   . Diabetes Mother   . Healthy Father     Social History   Tobacco Use  . Smoking status: Never Smoker  . Smokeless tobacco: Never Used  Vaping Use  . Vaping Use: Never used  Substance Use Topics  . Alcohol use: No  . Drug use: No    ROS   Objective:   Vitals: BP (!) 145/71 (BP Location: Right Arm)   Pulse 80    Temp 97.7 F (36.5 C) (Oral)   Resp 20   LMP  (Within Weeks) Comment: 2 weeks  SpO2 100%   Physical Exam Constitutional:      General: She is not in acute distress.    Appearance: Normal appearance. She is well-developed and normal weight. She is not ill-appearing, toxic-appearing or diaphoretic.  HENT:     Head: Normocephalic and atraumatic.     Right Ear: External ear normal.     Left Ear: External ear normal.     Nose: Nose normal.     Mouth/Throat:     Mouth: Mucous membranes are moist.     Pharynx: Oropharynx is clear.  Eyes:     General: No scleral icterus.    Extraocular Movements: Extraocular movements intact.     Pupils: Pupils are equal, round, and reactive to light.  Cardiovascular:     Rate and Rhythm: Normal rate and regular rhythm.     Pulses: Normal pulses.     Heart sounds: Normal heart sounds. No murmur heard. No friction rub. No gallop.   Pulmonary:     Effort: Pulmonary effort is normal. No respiratory distress.     Breath sounds: Normal breath sounds. No stridor. No wheezing, rhonchi or rales.  Abdominal:     General: Bowel sounds are normal. There is no  distension.     Palpations: Abdomen is soft. There is no mass.     Tenderness: There is no abdominal tenderness. There is no right CVA tenderness, left CVA tenderness, guarding or rebound.  Skin:    General: Skin is warm and dry.     Coloration: Skin is not pale.     Findings: No rash.  Neurological:     General: No focal deficit present.     Mental Status: She is alert and oriented to person, place, and time.  Psychiatric:        Mood and Affect: Mood normal.        Behavior: Behavior normal.        Thought Content: Thought content normal.        Judgment: Judgment normal.     Results for orders placed or performed during the hospital encounter of 11/17/20 (from the past 24 hour(s))  POCT Urinalysis Dipstick (ED/UC)     Status: None   Collection Time: 11/17/20  3:07 PM  Result Value Ref Range    Glucose, UA NEGATIVE NEGATIVE mg/dL   Bilirubin Urine NEGATIVE NEGATIVE   Ketones, ur NEGATIVE NEGATIVE mg/dL   Specific Gravity, Urine 1.015 1.005 - 1.030   Hgb urine dipstick NEGATIVE NEGATIVE   pH 6.0 5.0 - 8.0   Protein, ur NEGATIVE NEGATIVE mg/dL   Urobilinogen, UA 0.2 0.0 - 1.0 mg/dL   Nitrite NEGATIVE NEGATIVE   Leukocytes,Ua NEGATIVE NEGATIVE  POC urine preg, ED (not at [facility])     Status: None   Collection Time: 11/17/20  3:08 PM  Result Value Ref Range   Preg Test, Ur NEGATIVE NEGATIVE    Assessment and Plan :   PDMP not reviewed this encounter.  1. Urinary frequency   2. Lower abdominal pain   3. Constipation, unspecified constipation type     Recommended stopping the green tea and hydrating with plain water to avoid urinary irritation.  No sign of urinary tract urinalysis is normal.  Urine culture pending.  Counseled on need for dietary modifications as I suspect her abdominal pain is related to constipation.  Recommended using polyethylene glycol solution.  If symptoms persist, recommended to use an enema.  There is no signs of an acute abdomen on exam, vital signs are stable for outpatient management. Counseled patient on potential for adverse effects with medications prescribed/recommended today, ER and return-to-clinic precautions discussed, patient verbalized understanding.    Jaynee Eagles, [place] 11/17/20 1557

## 2020-11-17 NOTE — Discharge Instructions (Signed)
For moderate to severe constipation (not having a bowel movement in more than 3 days) then try to use Miralax or an enema once daily until you have a good bowel movement.  It is not a good idea to use laxatives regularly, for instance daily.  Try to stay active physically including regular exercise 2-3 times a week.  Make sure you hydrate well every day with about 64 ounces of water daily (that is 2 liters).  Try to avoid carb heavy foods, dairy. This includes cutting out breads, pasta, pizza, pastries, potatoes, rice, starchy foods in general. Eat more fiber as listed below:  Salads - kale, spinach, cabbage, spring mix; use seeds like pumpkin seeds or sunflower seeds, almonds; you can also use 1-2 hard boiled eggs in your salads Fruits - avocadoes, berries (blueberries, raspberries, blackberries), apples, oranges, pomegranate, grapefruit Vegetables - aspargus, cauliflower, broccoli, green beans, brussel spouts, bell peppers; stay away from starchy vegetables like potatoes, carrots, peas  Do not eat any foods on this list that you are allergic to.

## 2020-11-17 NOTE — ED Triage Notes (Addendum)
Pt praesents with increased urinary frequency, left sided lower back pain, abdominal pressure x 3 days. "I feel like Im pregnant again". Denies fever, chills.

## 2020-11-18 LAB — URINE CULTURE: Culture: NO GROWTH

## 2021-07-14 ENCOUNTER — Emergency Department (HOSPITAL_BASED_OUTPATIENT_CLINIC_OR_DEPARTMENT_OTHER)
Admission: EM | Admit: 2021-07-14 | Discharge: 2021-07-14 | Disposition: A | Discharge status: Home / Self Care | Payer: Self-pay | Attending: Emergency Medicine | Admitting: Emergency Medicine

## 2021-07-14 ENCOUNTER — Encounter (HOSPITAL_BASED_OUTPATIENT_CLINIC_OR_DEPARTMENT_OTHER): Payer: Self-pay

## 2021-07-14 ENCOUNTER — Other Ambulatory Visit: Payer: Self-pay

## 2021-07-14 DIAGNOSIS — R202 Paresthesia of skin: Secondary | ICD-10-CM | POA: Insufficient documentation

## 2021-07-14 DIAGNOSIS — M62838 Other muscle spasm: Secondary | ICD-10-CM | POA: Insufficient documentation

## 2021-07-14 MED ORDER — METHYLPREDNISOLONE 4 MG PO TBPK: ORAL_TABLET | ORAL | 0 refills | Status: DC

## 2021-07-14 MED ORDER — CYCLOBENZAPRINE HCL 10 MG PO TABS: 10.0000 mg | ORAL_TABLET | Freq: Two times a day (BID) | ORAL | 0 refills | Status: DC | PRN

## 2021-07-14 MED ORDER — KETOROLAC TROMETHAMINE 30 MG/ML IJ SOLN
30.0000 mg | Freq: Once | INTRAMUSCULAR | Status: AC
  Administered 2021-07-14: 30 mg via INTRAMUSCULAR
  Filled 2021-07-14: qty 1

## 2021-07-14 NOTE — ED Provider Notes (Signed)
Jenna Weaver Provider Note   CSN: [id number] Arrival date & time: 07/14/21  2042     History Chief Complaint  Patient presents with   Numbness    Jenna Weaver is a 50 y.o. female.  The history is provided by the patient.  Illness Location:  Right arm Quality:  Pain, pins and needles sensaton Severity:  Moderate Onset quality:  Gradual Duration:  2 weeks Timing:  Intermittent Progression:  Waxing and waning Chronicity:  New Relieved by:  Nothing Worsened by:  Nothing Associated symptoms: no abdominal pain, no chest pain, no congestion, no cough, no fever, no headaches, no nausea and no shortness of breath       History reviewed. No pertinent past medical history.  Patient Active Problem List   Diagnosis Date Noted   BV (bacterial vaginosis) 03/23/2013   Cervicitis 03/23/2013    Past Surgical History:  Procedure Laterality Date   TUBAL LIGATION       OB History   No obstetric history on file.     Family History  Problem Relation Age of Onset   Hypertension Mother    Diabetes Mother    Healthy Father     Social History   Tobacco Use   Smoking status: Never   Smokeless tobacco: Never  Vaping Use   Vaping Use: Never used  Substance Use Topics   Alcohol use: No   Drug use: No    Home Medications Prior to Admission medications   Medication Sig Start Date End Date Taking? Authorizing Provider  cyclobenzaprine (FLEXERIL) 10 MG tablet Take 1 tablet (10 mg total) by mouth 2 (two) times daily as needed for muscle spasms. 07/14/21  Yes Crystian Frith, DO  methylPREDNISolone (MEDROL DOSEPAK) 4 MG TBPK tablet Follow package insert 07/14/21  Yes Jenna Nickolson, DO  ferrous sulfate 325 (65 FE) MG tablet Take 1 tablet (325 mg total) by mouth daily. 07/24/18   Lawyer, Jenna Gave, PA-C  ibuprofen (ADVIL) 100 MG/5ML suspension Take 30 mLs (600 mg total) by mouth every 8 (eight) hours as needed for moderate pain. 11/17/20   Jenna Eagles, PA-C  polyethylene glycol (MIRALAX) 17 g packet Take 17 g by mouth daily. 11/17/20   Jenna Eagles, PA-C  omeprazole (PRILOSEC) 20 MG capsule Take 1 capsule (20 mg total) by mouth daily. Patient not taking: No sig reported 05/16/19 11/17/20  Jenna Gamble B, NP  pantoprazole (PROTONIX) 40 MG tablet Take 1 tablet (40 mg total) by mouth daily. 08/06/19 11/17/20  Jenna Bo, MD  ranitidine (ZANTAC) 150 MG tablet Take 1 tablet (150 mg total) by mouth 2 (two) times daily. Patient not taking: Reported on 07/13/2015 07/11/13 05/16/19  Jenna Felling, PA-C    Allergies    Codeine  Review of Systems   Review of Systems  Constitutional:  Negative for fever.  HENT:  Negative for congestion.   Respiratory:  Negative for cough and shortness of breath.   Cardiovascular:  Negative for chest pain.  Gastrointestinal:  Negative for abdominal pain and nausea.  Musculoskeletal:  Positive for arthralgias and neck pain. Negative for back pain and neck stiffness.  Skin:  Negative for color change and pallor.  Neurological:  Positive for numbness. Negative for dizziness, tremors, seizures, syncope, facial asymmetry, speech difficulty, weakness, light-headedness and headaches.   Physical Exam Updated Vital Signs BP (!) 182/92 (BP Location: Right Arm)   Pulse 68   Temp 98.3 F (36.8 C) (Oral)   Resp 16   Ht 5' (  1.524 m)   Wt 104.3 kg   SpO2 100%   BMI 44.92 kg/m   Physical Exam Vitals and nursing note reviewed.  Constitutional:      General: She is not in acute distress.    Appearance: She is well-developed. She is not ill-appearing.  HENT:     Head: Normocephalic and atraumatic.     Nose: Nose normal.     Mouth/Throat:     Mouth: Mucous membranes are moist.  Eyes:     Extraocular Movements: Extraocular movements intact.     Conjunctiva/sclera: Conjunctivae normal.     Pupils: Pupils are equal, round, and reactive to light.  Neck:     Comments: No midline spinal tenderness, tenderness to  right-sided para spinal muscles of the cervical spine with increased tone into the right trapezius Cardiovascular:     Rate and Rhythm: Normal rate and regular rhythm.     Pulses: Normal pulses.     Heart sounds: No murmur heard. Pulmonary:     Effort: Pulmonary effort is normal. No respiratory distress.     Breath sounds: Normal breath sounds.  Abdominal:     General: Abdomen is flat.     Palpations: Abdomen is soft.     Tenderness: There is no abdominal tenderness.  Musculoskeletal:        General: Tenderness present.     Cervical back: Normal range of motion and neck supple.     Comments: Tenderness within the right trapezius area, no midline spinal tenderness  Skin:    General: Skin is warm and dry.     Capillary Refill: Capillary refill takes less than 2 seconds.  Neurological:     General: No focal deficit present.     Mental Status: She is alert and oriented to person, place, and time.     Cranial Nerves: No cranial nerve deficit.     Sensory: No sensory deficit.     Motor: No weakness.     Coordination: Coordination normal.     Comments: 5+ out of 5 strength throughout, normal sensation, normal finger-nose-finger, no drift, normal speech    ED Results / Procedures / Treatments   Labs (all labs ordered are listed, but only abnormal results are displayed) Labs Reviewed - No data to display  EKG None  Radiology No results found.  Procedures Procedures   Medications Ordered in ED Medications  ketorolac (TORADOL) 30 MG/ML injection 30 mg (has no administration in time range)    ED Course  I have reviewed the triage vital signs and the nursing notes.  Pertinent labs & imaging results that were available during my care of the patient were reviewed by me and considered in my medical decision making (see chart for details).    MDM Rules/Calculators/A&P                           Maliea Jenna Weaver is here with right-sided neck pain, right shoulder pain,  numbness and tingling down her right arm.  Unremarkable vitals.  No fever.  Denies any chest pain or shortness of breath or abdominal pain.  No headaches, no fever.  No weakness of the extremities.  She has had issue for the last 2 weeks with a pins and needle sensation from her right shoulder down to her hands.  Some neck pain as well.  Denies any trauma.  Has no midline spinal pain.  She has increased tone to the paraspinal muscles  of her cervical spine with increased tone into her right trapezius muscle.  She actually has normal strength and sensation to her upper extremities.  Overall suspect muscle spasm versus some arthritic process/stenosis.  Will prescribe Medrol Dosepak, Flexeril.  We will refer her to wellness center she does not have a primary care doctor.  Will refer to sports medicine as well.  She has no severe weakness and overall no concern for acute cord compression or acute surgical process.  Suspect she will get improvement with anti-inflammatories.  Discharged in good condition.  She understands return precautions.  This chart was dictated using voice recognition software.  Despite best efforts to proofread,  errors can occur which can change the documentation meaning.   Final Clinical Impression(s) / ED Diagnoses Final diagnoses:  Paresthesia  Muscle spasm    Rx / DC Orders ED Discharge Orders          Ordered    methylPREDNISolone (MEDROL DOSEPAK) 4 MG TBPK tablet        07/14/21 2303    cyclobenzaprine (FLEXERIL) 10 MG tablet  2 times daily PRN        07/14/21 2303             Lennice Sites, DO 07/14/21 2331

## 2021-07-14 NOTE — ED Triage Notes (Signed)
Patient here POV from Home with Numbness.  Patient states she began having Numbness and Pain radiating from Right Lower Neck to Right Fingers. Patient states Symptoms began approximately 3 weeks PTA. Tylenol and Voltaren at Home have been moderately effective in alleviating Pain.  NAD Noted during Triage. Ambulatory. A&Ox4. GCS 15. No Neurological Deficits noted during Triage.

## 2021-07-14 NOTE — ED Notes (Signed)
Pt A&OX4 ambulatory at d/c with independent steady gait. Pt states she feels a lot better

## 2021-07-16 ENCOUNTER — Other Ambulatory Visit: Payer: Self-pay

## 2021-07-16 ENCOUNTER — Encounter: Payer: Self-pay | Admitting: Family Medicine

## 2021-07-16 ENCOUNTER — Ambulatory Visit (INDEPENDENT_AMBULATORY_CARE_PROVIDER_SITE_OTHER): Payer: Self-pay | Admitting: Family Medicine

## 2021-07-16 VITALS — Ht 60.0 in | Wt 230.0 lb

## 2021-07-16 DIAGNOSIS — M5412 Radiculopathy, cervical region: Secondary | ICD-10-CM

## 2021-07-16 MED ORDER — HYDROCODONE-ACETAMINOPHEN 5-325 MG PO TABS: 1.0000 | ORAL_TABLET | Freq: Three times a day (TID) | ORAL | 0 refills | Status: DC | PRN

## 2021-07-16 MED ORDER — GABAPENTIN 100 MG PO CAPS: 100.0000 mg | ORAL_CAPSULE | Freq: Three times a day (TID) | ORAL | 3 refills | Status: DC

## 2021-07-16 MED ORDER — MELOXICAM 7.5 MG PO TABS: 7.5000 mg | ORAL_TABLET | Freq: Two times a day (BID) | ORAL | 1 refills | Status: DC | PRN

## 2021-07-16 NOTE — Patient Instructions (Signed)
Nice to meet you Please try heat  Please start with one gabapentin at night. It can make you sleepy. You can increase to 2 or 3 times daily as you tolerate.  Please use the mobic as needed   Please send me a message in MyChart with any questions or updates.  Please see me back in 2 weeks.   --Dr. Raeford Razor

## 2021-07-16 NOTE — Assessment & Plan Note (Signed)
Symptoms most consistent with radicular type pain.  Neurovascularly intact on exam today. -Counseled on home exercise therapy and supportive care. -Gabapentin. -Mobic. -Norco as needed. -Could consider imaging or physical therapy.

## 2021-07-16 NOTE — Progress Notes (Signed)
  Jenna Weaver - 50 y.o. female MRN [id number]  Date of birth: February 16, 1971  SUBJECTIVE:  Including CC & ROS.  No chief complaint on file.   Jenna Weaver is a 50 y.o. female that is presenting with right trapezius pain and arm pain.  Pain has been ongoing for the past few weeks.  Can be severe at times.  Having altered sensation within the hand.  Has tried a muscle relaxer.   Review of Systems See HPI   HISTORY: Past Medical, Surgical, Social, and Family History Reviewed & Updated per EMR.   Pertinent Historical Findings include:  History reviewed. No pertinent past medical history.  Past Surgical History:  Procedure Laterality Date   TUBAL LIGATION      Family History  Problem Relation Age of Onset   Hypertension Mother    Diabetes Mother    Healthy Father     Social History   Socioeconomic History   Marital status: Single    Spouse name: Not on file   Number of children: Not on file   Years of education: Not on file   Highest education level: Not on file  Occupational History   Not on file  Tobacco Use   Smoking status: Never   Smokeless tobacco: Never  Vaping Use   Vaping Use: Never used  Substance and Sexual Activity   Alcohol use: No   Drug use: No   Sexual activity: Yes    Birth control/protection: Surgical  Other Topics Concern   Not on file  Social History Narrative   Not on file   Social Determinants of Health   Financial Resource Strain: Not on file  Food Insecurity: Not on file  Transportation Needs: Not on file  Physical Activity: Not on file  Stress: Not on file  Social Connections: Not on file  Intimate Partner Violence: Not on file     PHYSICAL EXAM:  VS: Ht 5' (1.524 m)   Wt 230 lb (104.3 kg)   BMI 44.92 kg/m  Physical Exam Gen: NAD, alert, cooperative with exam, well-appearing      ASSESSMENT & PLAN:   Cervical radiculopathy Symptoms most consistent with radicular type pain.  Neurovascularly intact on exam  today. -Counseled on home exercise therapy and supportive care. -Gabapentin. -Mobic. -Norco as needed. -Could consider imaging or physical therapy.

## 2021-07-21 ENCOUNTER — Other Ambulatory Visit: Payer: Self-pay

## 2021-07-21 ENCOUNTER — Telehealth (INDEPENDENT_AMBULATORY_CARE_PROVIDER_SITE_OTHER): Payer: Self-pay | Admitting: Nurse Practitioner

## 2021-07-21 ENCOUNTER — Encounter: Payer: Self-pay | Admitting: Nurse Practitioner

## 2021-07-21 DIAGNOSIS — M5412 Radiculopathy, cervical region: Secondary | ICD-10-CM

## 2021-07-21 NOTE — Patient Instructions (Addendum)
Cervical radiculopathy:  Continue current medications  Continue home exercises per ortho  Elevated Blood pressure:  Discussed lifestyle changes  Low sodium diet  Keep blood pressure log  Follow up:  Follow up with ortho as scheduled  Follow up to establish care with Dr. Redmond Pulling or Amy and for physical  Preventing Hypertension Hypertension, also called high blood pressure, is when the force of blood pumping through the arteries is too strong. Arteries are blood vessels that carry blood from the heart throughout the body. Often, hypertension does not cause symptoms until blood pressure is very high. It is important to have your blood pressure checked regularly. Diet and lifestyle changes can help you prevent hypertension, and they may make you feel better overall and improve your quality of life. If you already have hypertension, you may control it with diet and lifestyle changes, as well as with medicine. How can this condition affect me? Over time, hypertension can damage the arteries and decrease blood flow to important parts of the body, including the brain, heart, and kidneys. By keeping your blood pressure in a healthy range, you can help prevent complications like heart attack, heart failure, stroke, kidney failure, and vascular dementia. What can increase my risk? Being an older adult. Older people are more often affected. Having family members who have had high blood pressure. Being obese. Being female. Males are more likely to have high blood pressure. Drinking too much alcohol or caffeine. Smoking or using illegal drugs. Taking certain medicines, such as antidepressants, decongestants, birth control pills, and NSAIDs, such as ibuprofen. Having thyroid problems. Having certain tumors. What actions can I take to prevent or manage this condition? Work with your health care provider to make a hypertension prevention plan that works for you. Follow your plan and keep all  follow-up visits as told by your health care provider. Diet changes Maintain a healthy diet. This includes: Eating less salt (sodium). Ask your health care provider how much sodium is safe for you to have. The general recommendation is to have less than 1 tsp (2,300 mg) of sodium a day. Do not add salt to your food. Choose low-sodium options when grocery shopping and eating out. Limiting fats in your diet. You can do this by eating low-fat or fat-free dairy products and by eating less red meat. Eating more fruits, vegetables, and whole grains. Make a goal to eat: 1-2 cups of fresh fruits and vegetables each day. 3-4 servings of whole grains each day. Avoiding foods and beverages that have added sugars. Eating fish that contain healthy fats (omega-3 fatty acids), such as mackerel or salmon. If you need help putting together a healthy eating plan, try the DASH diet. This diet is high in fruits, vegetables, and whole grains. It is low in sodium, red meat, and added sugars. DASH stands for Dietary Approaches to Stop Hypertension. Lifestyle changes Lose weight if you are overweight. Losing just 3?5% of your body weight can help prevent or control hypertension. For example, if your present weight is 200 lb (91 kg), a loss of 3-5% of your weight means losing 6-10 lb (2.7-4.5 kg). Ask your health care provider to help you with a diet and exercise plan to safely lose weight. Other recommendations usually include: Get enough exercise. Do at least 150 minutes of moderate-intensity exercise each week. You could do this in short exercise sessions several times a day, or you could do longer exercise sessions a few times a week. For example, you could take a  brisk 10-minute walk or bike ride, 3 times a day, for 5 days a week. Find ways to reduce stress, such as exercising, meditating, listening to music, or taking a yoga class. If you need help reducing stress, ask your health care provider. Do not use any  products that contain nicotine or tobacco, such as cigarettes, e-cigarettes, and chewing tobacco. If you need help quitting, ask your health care provider. Chemicals in tobacco and nicotine products raise your blood pressure each time you use them. If you need help quitting, ask your health care provider. Learn how to check your blood pressure at home. Make sure that you know your personal target blood pressure, as told by your health care provider. Try to sleep 7-9 hours per night.  Alcohol use Do not drink alcohol if: Your health care provider tells you not to drink. You are pregnant, may be pregnant, or are planning to become pregnant. If you drink alcohol: Limit how much you use to: 0-1 drink a day for women. 0-2 drinks a day for men. Be aware of how much alcohol is in your drink. In the [place] one drink equals one 12 oz bottle of beer (355 mL), one 5 oz glass of wine (148 mL), or one 1 oz glass of hard liquor (44 mL). Medicines In addition to diet and lifestyle changes, your health care provider may recommend medicines to help lower your blood pressure. In general: You may need to try a few different medicines to find what works best for you. You may need to take more than one medicine. Take over-the-counter and prescription medicines only as told by your health care provider. Questions to ask your health care provider What is my blood pressure goal? How can I lower my risk for high blood pressure? How should I monitor my blood pressure at home? Where to find support Your health care provider can help you prevent hypertension and help you keep your blood pressure at a healthy level. Your local hospital or your community may also provide support services and prevention programs. The American Heart Association offers an online support network at supportnetwork.heart.org Where to find more information Learn more about hypertension from: [place], Lung, and [place]:  https://wilson-eaton.com/ Centers for Disease Control and Prevention: http://www.wolf.info/ American Academy of Family Physicians: familydoctor.org Learn more about the DASH diet from: [place], Lung, and [place]: https://wilson-eaton.com/ Contact a health care provider if: You think you are having a reaction to medicines you have taken. You have recurrent headaches or feel dizzy. You have swelling in your ankles. You have trouble with your vision. Get help right away if: You have sudden, severe chest, back, or abdominal pain or discomfort. You have shortness of breath. You have a sudden, severe headache. These symptoms may represent a serious problem that is an emergency. Do not wait to see if the symptoms will go away. Get medical help right away. Call your local emergency services (911 in the [place] Do not drive yourself to the hospital.  Summary Hypertension often does not cause any symptoms until blood pressure is very high. It is important to get your blood pressure checked regularly. Diet and lifestyle changes are important steps in preventing hypertension. By keeping your blood pressure in a healthy range, you may prevent complications like heart attack, heart failure, stroke, and kidney failure. Work with your health care provider to make a hypertension prevention plan that works for you. This information is not intended to replace advice given to you  by your health care provider. Make sure you discuss any questions you have with your health care provider. Document Revised: 09/17/2019 Document Reviewed: 09/17/2019 Elsevier Patient Education  2022 [employer].

## 2021-07-21 NOTE — Progress Notes (Signed)
Virtual Visit via Telephone Note  I connected with Jenna Weaver on 07/21/21 at  9:30 AM EDT by telephone and verified that I am speaking with the correct person using two identifiers.  Location: Patient: home Provider: office   I discussed the limitations, risks, security and privacy concerns of performing an evaluation and management service by telephone and the availability of in person appointments. I also discussed with the patient that there may be a patient responsible charge related to this service. The patient expressed understanding and agreed to proceed.   History of Present Illness:  Patient presents today for a telephone visit for ED follow-up.  Patient was seen in the ED on 07/14/2021 and was referred to Ortho for paresthesia.  Patient has followed with Ortho and was diagnosed with cervical radiculopathy.  She does have an upcoming follow-up appointment with them and around a week.  Patient states that she is feeling 100% better after treatment from Ortho.  She was treated with home exercise and supportive care, gabapentin, Mobic, Norco.  Overall patient is doing well and plans to follow-up with Ortho as scheduled.  Patient denies any significant health history. Patient does not currently have a PCP and does need routine health care.  We will get her set up to establish care with a PCP in our office. She is concerned about recent elevated blood pressure, but thinks it could be due to the pain she has been feeling. We discussed lifestyle modificationsDenies f/c/s, n/v/d, hemoptysis, PND, chest pain or edema.     Observations/Objective:  Vitals with BMI 07/16/2021 07/14/2021 07/14/2021  Height 5\' 0"  - 5\' 0"   Weight 230 lbs - 230 lbs  BMI 96.22 - 29.79  Systolic - 892 -  Diastolic - 88 -  Pulse - 71 -      Assessment and Plan:  Cervical radiculopathy:  Continue current medications  Continue home exercises per ortho  Elevated Blood pressure:  Discussed lifestyle  changes  Low sodium diet  Keep blood pressure log  Follow up:  Follow up with ortho as scheduled  Follow up to establish care with Dr. Redmond Pulling or Amy and for physical  Patient Instructions  Cervical radiculopathy:  Continue current medications  Continue home exercises per ortho  Elevated Blood pressure:  Discussed lifestyle changes  Low sodium diet  Keep blood pressure log  Follow up:  Follow up with ortho as scheduled  Follow up to establish care with Dr. Redmond Pulling or Amy and for physical  Preventing Hypertension Hypertension, also called high blood pressure, is when the force of blood pumping through the arteries is too strong. Arteries are blood vessels that carry blood from the heart throughout the body. Often, hypertension does not cause symptoms until blood pressure is very high. It is important to have your blood pressure checked regularly. Diet and lifestyle changes can help you prevent hypertension, and they may make you feel better overall and improve your quality of life. If you already have hypertension, you may control it with diet and lifestyle changes, as well as with medicine. How can this condition affect me? Over time, hypertension can damage the arteries and decrease blood flow to important parts of the body, including the brain, heart, and kidneys. By keeping your blood pressure in a healthy range, you can help prevent complications like heart attack, heart failure, stroke, kidney failure, and vascular dementia. What can increase my risk? Being an older adult. Older people are more often affected. Having family members who have  had high blood pressure. Being obese. Being female. Males are more likely to have high blood pressure. Drinking too much alcohol or caffeine. Smoking or using illegal drugs. Taking certain medicines, such as antidepressants, decongestants, birth control pills, and NSAIDs, such as ibuprofen. Having thyroid problems. Having certain  tumors. What actions can I take to prevent or manage this condition? Work with your health care provider to make a hypertension prevention plan that works for you. Follow your plan and keep all follow-up visits as told by your health care provider. Diet changes Maintain a healthy diet. This includes: Eating less salt (sodium). Ask your health care provider how much sodium is safe for you to have. The general recommendation is to have less than 1 tsp (2,300 mg) of sodium a day. Do not add salt to your food. Choose low-sodium options when grocery shopping and eating out. Limiting fats in your diet. You can do this by eating low-fat or fat-free dairy products and by eating less red meat. Eating more fruits, vegetables, and whole grains. Make a goal to eat: 1-2 cups of fresh fruits and vegetables each day. 3-4 servings of whole grains each day. Avoiding foods and beverages that have added sugars. Eating fish that contain healthy fats (omega-3 fatty acids), such as mackerel or salmon. If you need help putting together a healthy eating plan, try the DASH diet. This diet is high in fruits, vegetables, and whole grains. It is low in sodium, red meat, and added sugars. DASH stands for Dietary Approaches to Stop Hypertension. Lifestyle changes Lose weight if you are overweight. Losing just 3?5% of your body weight can help prevent or control hypertension. For example, if your present weight is 200 lb (91 kg), a loss of 3-5% of your weight means losing 6-10 lb (2.7-4.5 kg). Ask your health care provider to help you with a diet and exercise plan to safely lose weight. Other recommendations usually include: Get enough exercise. Do at least 150 minutes of moderate-intensity exercise each week. You could do this in short exercise sessions several times a day, or you could do longer exercise sessions a few times a week. For example, you could take a brisk 10-minute walk or bike ride, 3 times a day, for 5 days a  week. Find ways to reduce stress, such as exercising, meditating, listening to music, or taking a yoga class. If you need help reducing stress, ask your health care provider. Do not use any products that contain nicotine or tobacco, such as cigarettes, e-cigarettes, and chewing tobacco. If you need help quitting, ask your health care provider. Chemicals in tobacco and nicotine products raise your blood pressure each time you use them. If you need help quitting, ask your health care provider. Learn how to check your blood pressure at home. Make sure that you know your personal target blood pressure, as told by your health care provider. Try to sleep 7-9 hours per night.  Alcohol use Do not drink alcohol if: Your health care provider tells you not to drink. You are pregnant, may be pregnant, or are planning to become pregnant. If you drink alcohol: Limit how much you use to: 0-1 drink a day for women. 0-2 drinks a day for men. Be aware of how much alcohol is in your drink. In the [place] one drink equals one 12 oz bottle of beer (355 mL), one 5 oz glass of wine (148 mL), or one 1 oz glass of hard liquor (44 mL). Medicines In addition to  diet and lifestyle changes, your health care provider may recommend medicines to help lower your blood pressure. In general: You may need to try a few different medicines to find what works best for you. You may need to take more than one medicine. Take over-the-counter and prescription medicines only as told by your health care provider. Questions to ask your health care provider What is my blood pressure goal? How can I lower my risk for high blood pressure? How should I monitor my blood pressure at home? Where to find support Your health care provider can help you prevent hypertension and help you keep your blood pressure at a healthy level. Your local hospital or your community may also provide support services and prevention programs. The American Heart  Association offers an online support network at supportnetwork.heart.org Where to find more information Learn more about hypertension from: [place], Lung, and [place]: https://wilson-eaton.com/ Centers for Disease Control and Prevention: http://www.wolf.info/ American Academy of Family Physicians: familydoctor.org Learn more about the DASH diet from: [place], Lung, and [place]: https://wilson-eaton.com/ Contact a health care provider if: You think you are having a reaction to medicines you have taken. You have recurrent headaches or feel dizzy. You have swelling in your ankles. You have trouble with your vision. Get help right away if: You have sudden, severe chest, back, or abdominal pain or discomfort. You have shortness of breath. You have a sudden, severe headache. These symptoms may represent a serious problem that is an emergency. Do not wait to see if the symptoms will go away. Get medical help right away. Call your local emergency services (911 in the [place] Do not drive yourself to the hospital.  Summary Hypertension often does not cause any symptoms until blood pressure is very high. It is important to get your blood pressure checked regularly. Diet and lifestyle changes are important steps in preventing hypertension. By keeping your blood pressure in a healthy range, you may prevent complications like heart attack, heart failure, stroke, and kidney failure. Work with your health care provider to make a hypertension prevention plan that works for you. This information is not intended to replace advice given to you by your health care provider. Make sure you discuss any questions you have with your health care provider. Document Revised: 09/17/2019 Document Reviewed: 09/17/2019 Elsevier Patient Education  2022 [employer].     I discussed the assessment and treatment plan with the patient. The patient was provided an opportunity to ask questions and all were answered. The  patient agreed with the plan and demonstrated an understanding of the instructions.   The patient was advised to call back or seek an in-person evaluation if the symptoms worsen or if the condition fails to improve as anticipated.  I provided 23 minutes of non-face-to-face time during this encounter.   Fenton Foy, NP

## 2021-07-30 ENCOUNTER — Ambulatory Visit: Payer: Self-pay | Admitting: Family Medicine

## 2021-07-30 NOTE — Progress Notes (Deleted)
  Jenna Weaver - 50 y.o. female MRN [id number]  Date of birth: 03-24-1971  SUBJECTIVE:  Including CC & ROS.  No chief complaint on file.   Jenna Weaver is a 50 y.o. female that is  ***.  ***   Review of Systems See HPI   HISTORY: Past Medical, Surgical, Social, and Family History Reviewed & Updated per EMR.   Pertinent Historical Findings include:  No past medical history on file.  Past Surgical History:  Procedure Laterality Date   TUBAL LIGATION      Family History  Problem Relation Age of Onset   Hypertension Mother    Diabetes Mother    Healthy Father     Social History   Socioeconomic History   Marital status: Single    Spouse name: Not on file   Number of children: Not on file   Years of education: Not on file   Highest education level: Not on file  Occupational History   Not on file  Tobacco Use   Smoking status: Never   Smokeless tobacco: Never  Vaping Use   Vaping Use: Never used  Substance and Sexual Activity   Alcohol use: No   Drug use: No   Sexual activity: Yes    Birth control/protection: Surgical  Other Topics Concern   Not on file  Social History Narrative   Not on file   Social Determinants of Health   Financial Resource Strain: Not on file  Food Insecurity: Not on file  Transportation Needs: Not on file  Physical Activity: Not on file  Stress: Not on file  Social Connections: Not on file  Intimate Partner Violence: Not on file     PHYSICAL EXAM:  VS: There were no vitals taken for this visit. Physical Exam Gen: NAD, alert, cooperative with exam, well-appearing MSK:  ***      ASSESSMENT & PLAN:   No problem-specific Assessment & Plan notes found for this encounter.

## 2021-09-06 ENCOUNTER — Encounter (HOSPITAL_BASED_OUTPATIENT_CLINIC_OR_DEPARTMENT_OTHER): Payer: Self-pay | Admitting: *Deleted

## 2021-09-06 ENCOUNTER — Emergency Department (HOSPITAL_BASED_OUTPATIENT_CLINIC_OR_DEPARTMENT_OTHER)
Admission: EM | Admit: 2021-09-06 | Discharge: 2021-09-06 | Disposition: A | Discharge status: Home / Self Care | Payer: Self-pay | Attending: Emergency Medicine | Admitting: Emergency Medicine

## 2021-09-06 ENCOUNTER — Emergency Department (HOSPITAL_BASED_OUTPATIENT_CLINIC_OR_DEPARTMENT_OTHER): Payer: Self-pay

## 2021-09-06 ENCOUNTER — Other Ambulatory Visit: Payer: Self-pay

## 2021-09-06 DIAGNOSIS — Z20822 Contact with and (suspected) exposure to covid-19: Secondary | ICD-10-CM | POA: Insufficient documentation

## 2021-09-06 DIAGNOSIS — J029 Acute pharyngitis, unspecified: Secondary | ICD-10-CM | POA: Insufficient documentation

## 2021-09-06 LAB — RESP PANEL BY RT-PCR (FLU A&B, COVID) ARPGX2
Influenza A by PCR: NEGATIVE
Influenza B by PCR: NEGATIVE
SARS Coronavirus 2 by RT PCR: NEGATIVE

## 2021-09-06 LAB — GROUP A STREP BY PCR: Group A Strep by PCR: NOT DETECTED

## 2021-09-06 MED ORDER — DEXAMETHASONE 1 MG/ML PO CONC: 10.0000 mg | Freq: Once | ORAL | Status: DC

## 2021-09-06 MED ORDER — CEPACOL REGULAR STRENGTH 3 MG MT LOZG: 1.0000 | LOZENGE | OROMUCOSAL | 0 refills | Status: DC | PRN

## 2021-09-06 MED ORDER — DEXAMETHASONE 4 MG PO TABS
10.0000 mg | ORAL_TABLET | Freq: Once | ORAL | Status: AC
  Administered 2021-09-06: 10 mg via ORAL
  Filled 2021-09-06: qty 3

## 2021-09-06 MED ORDER — FLUTICASONE PROPIONATE 50 MCG/ACT NA SUSP: 2.0000 | Freq: Every day | NASAL | 0 refills | Status: DC

## 2021-09-06 MED ORDER — CETIRIZINE HCL 10 MG PO TABS: 10.0000 mg | ORAL_TABLET | Freq: Every day | ORAL | 0 refills | Status: DC

## 2021-09-06 MED ORDER — FLUTICASONE PROPIONATE 50 MCG/ACT NA SUSP
2.0000 | Freq: Every day | NASAL | 0 refills | Status: DC
Start: 2021-09-06 — End: 2021-09-06

## 2021-09-06 NOTE — ED Provider Notes (Signed)
[facility] Provider Note   CSN: [id number] Arrival date & time: 09/06/21  1556     History Chief Complaint  Patient presents with   Sore Throat    Jenna Weaver is a 50 y.o. female.  This is a 50 y.o. female with significant medical history as below, including BV who presents to the ED with complaint of sore throat  Location:  right side, sore throat Duration:  2-3 wks Onset:  gradual Timing:  constant Description:  irritation, pain to throat, right > left Severity:  mild Exacerbating/Alleviating Factors:  improved with cold drinks, hot liquids, cough drops Associated Symptoms:  none Pertinent Negatives:  no dysphagia, dysphonia, fevers, chills, drooling, nausea, emesis. No cp or dyspnea    The history is provided by the patient. No language interpreter was used.  Sore Throat Pertinent negatives include no chest pain, no abdominal pain, no headaches and no shortness of breath.      History reviewed. No pertinent past medical history.  Patient Active Problem List   Diagnosis Date Noted   Cervical radiculopathy 07/16/2021   BV (bacterial vaginosis) 03/23/2013   Cervicitis 03/23/2013    Past Surgical History:  Procedure Laterality Date   TUBAL LIGATION       OB History   No obstetric history on file.     Family History  Problem Relation Age of Onset   Hypertension Mother    Diabetes Mother    Healthy Father     Social History   Tobacco Use   Smoking status: Never   Smokeless tobacco: Never  Vaping Use   Vaping Use: Never used  Substance Use Topics   Alcohol use: No   Drug use: No    Home Medications Prior to Admission medications   Medication Sig Start Date End Date Taking? Authorizing Provider  cetirizine (ZYRTEC ALLERGY) 10 MG tablet Take 1 tablet (10 mg total) by mouth daily for 14 days. 09/06/21 09/20/21  Wynona Dove A, DO  ferrous sulfate 325 (65 FE) MG tablet Take 1 tablet (325 mg total) by mouth  daily. 07/24/18   Lawyer, Harrell Gave, PA-C  fluticasone (FLONASE) 50 MCG/ACT nasal spray Place 2 sprays into both nostrils daily. 09/06/21   Jeanell Sparrow, DO  gabapentin (NEURONTIN) 100 MG capsule Take 1 capsule (100 mg total) by mouth 3 (three) times daily. 07/16/21   Rosemarie Ax, MD  HYDROcodone-acetaminophen (NORCO/VICODIN) 5-325 MG tablet Take 1 tablet by mouth every 8 (eight) hours as needed. 07/16/21   Rosemarie Ax, MD  meloxicam (MOBIC) 7.5 MG tablet Take 1 tablet (7.5 mg total) by mouth 2 (two) times daily as needed. 07/16/21   Rosemarie Ax, MD  menthol-cetylpyridinium (CEPACOL REGULAR STRENGTH) 3 MG lozenge Take 1 lozenge (3 mg total) by mouth as needed for sore throat. 09/06/21   Jeanell Sparrow, DO  methylPREDNISolone (MEDROL DOSEPAK) 4 MG TBPK tablet Follow package insert 07/14/21   Curatolo, Adam, DO  polyethylene glycol (MIRALAX) 17 g packet Take 17 g by mouth daily. 11/17/20   Jaynee Eagles, PA-C  omeprazole (PRILOSEC) 20 MG capsule Take 1 capsule (20 mg total) by mouth daily. Patient not taking: No sig reported 05/16/19 11/17/20  Augusto Gamble B, NP  pantoprazole (PROTONIX) 40 MG tablet Take 1 tablet (40 mg total) by mouth daily. 08/06/19 11/17/20  Daleen Bo, MD  ranitidine (ZANTAC) 150 MG tablet Take 1 tablet (150 mg total) by mouth 2 (two) times daily. Patient not taking: Reported on 07/13/2015  07/11/13 05/16/19  Ignacia Felling, PA-C    Allergies    Codeine  Review of Systems   Review of Systems  Constitutional:  Negative for activity change and fever.  HENT:  Positive for postnasal drip and sore throat. Negative for facial swelling and trouble swallowing.   Eyes:  Negative for discharge and redness.  Respiratory:  Negative for cough and shortness of breath.   Cardiovascular:  Negative for chest pain and palpitations.  Gastrointestinal:  Negative for abdominal pain and nausea.  Genitourinary:  Negative for dysuria and flank pain.  Musculoskeletal:  Negative for  back pain and gait problem.  Skin:  Negative for pallor and rash.  Neurological:  Negative for syncope and headaches.   Physical Exam Updated Vital Signs BP (!) 136/53   Pulse 77   Temp 98.3 F (36.8 C) (Oral)   Resp 18   Ht 5\' 1"  (1.549 m)   Wt 93 kg   SpO2 100%   BMI 38.73 kg/m   Physical Exam Vitals and nursing note reviewed.  Constitutional:      General: She is not in acute distress.    Appearance: Normal appearance.  HENT:     Head: Normocephalic and atraumatic. No raccoon eyes, Battle's sign, right periorbital erythema or left periorbital erythema.     Right Ear: External ear normal.     Left Ear: External ear normal.     Nose: Nose normal.     Mouth/Throat:     Mouth: Mucous membranes are moist.     Pharynx: Uvula midline. Posterior oropharyngeal erythema present. No oropharyngeal exudate or uvula swelling.     Comments: Minimal erythema and cobblestoning to posterior oropharynx.  Uvula is midline, no evidence of deep space infection.  No swelling to space below tongue.  No evidence of Ludwig's angina.  Speaking clearly in full sentences.  No drooling, stridor or trismus Eyes:     General: No scleral icterus.       Right eye: No discharge.        Left eye: No discharge.  Cardiovascular:     Rate and Rhythm: Normal rate and regular rhythm.     Pulses: Normal pulses.     Heart sounds: Normal heart sounds.  Pulmonary:     Effort: Pulmonary effort is normal. No respiratory distress.     Breath sounds: Normal breath sounds.  Abdominal:     General: Abdomen is flat.     Tenderness: There is no abdominal tenderness.  Musculoskeletal:        General: Normal range of motion.     Cervical back: Normal range of motion.     Right lower leg: No edema.     Left lower leg: No edema.  Skin:    General: Skin is warm and dry.     Capillary Refill: Capillary refill takes less than 2 seconds.  Neurological:     Mental Status: She is alert.  Psychiatric:        Mood and  Affect: Mood normal.        Behavior: Behavior normal.    ED Results / Procedures / Treatments   Labs (all labs ordered are listed, but only abnormal results are displayed) Labs Reviewed  RESP PANEL BY RT-PCR (FLU A&B, COVID) [id number]  GROUP A STREP BY PCR    EKG None  Radiology DG Chest Portable 1 View  Result Date: 09/06/2021 CLINICAL DATA:  Cough. EXAM: PORTABLE CHEST 1 VIEW COMPARISON:  Chest radiograph dated 07/02/2019.  FINDINGS: The heart size and mediastinal contours are within normal limits. Both lungs are clear. The visualized skeletal structures are unremarkable. IMPRESSION: No active disease. Electronically Signed   By: Anner Crete M.D.   On: 09/06/2021 19:42    Procedures Procedures   Medications Ordered in ED Medications  dexamethasone (DECADRON) tablet 10 mg (has no administration in time range)    ED Course  I have reviewed the triage vital signs and the nursing notes.  Pertinent labs & imaging results that were available during my care of the patient were reviewed by me and considered in my medical decision making (see chart for details).    MDM Rules/Calculators/A&P                           CC: sore throat  This patient complains of above; this involves an extensive number of treatment options and is a complaint that carries with it a high risk of complications and morbidity. Vital signs were reviewed. Serious etiologies considered.  Record review:   Previous records obtained and reviewed    Work up as above, notable for:  Labs & imaging results that were available during my care of the patient were reviewed by me and considered in my medical decision making.   I ordered imaging studies which included CXR and I independently visualized and interpreted imaging which showed stable  Management: Give oral decadron  Reassessment:  Pt reports feeling better. She is tolerating oral intake without difficulty. Suspicion for viral pharyngitis. Strep  / flu/ covid negative.  Very low suspicion for deep space infection of throat.  Patient presented to the ER today for fever symptoms. On exam was well appearing and non toxic. Vitals were reviewed. Patient has no symptoms of otitis media, pneumonia,  bacterial pharyngitis or other serious bacterial illness. Respiratory status is unremarkable without wheezing distress. At this point in time, patient likely has viral syndrome with no indications for antibiotics. I do not suspect UTI at this time. I have recommended fluids and anti-pyretics for symptomatic control.    The patient improved significantly and was discharged in stable condition. Detailed discussions were had with the patient regarding current findings, and need for close f/u with PCP or on call doctor. The patient has been instructed to return immediately if the symptoms worsen in any way for re-evaluation. Patient verbalized understanding and is in agreement with current care plan. All questions answered prior to discharge.        This chart was dictated using voice recognition software.  Despite best efforts to proofread,  errors can occur which can change the documentation meaning.  Final Clinical Impression(s) / ED Diagnoses Final diagnoses:  Pharyngitis, unspecified etiology    Rx / DC Orders ED Discharge Orders          Ordered    fluticasone (FLONASE) 50 MCG/ACT nasal spray  Daily,   Status:  Discontinued        09/06/21 2100    menthol-cetylpyridinium (CEPACOL REGULAR STRENGTH) 3 MG lozenge  As needed,   Status:  Discontinued        09/06/21 2100    cetirizine (ZYRTEC ALLERGY) 10 MG tablet  Daily,   Status:  Discontinued        09/06/21 2100    cetirizine (ZYRTEC ALLERGY) 10 MG tablet  Daily        09/06/21 2100    fluticasone (FLONASE) 50 MCG/ACT nasal spray  Daily  09/06/21 2100    menthol-cetylpyridinium (CEPACOL REGULAR STRENGTH) 3 MG lozenge  As needed        09/06/21 2100             Jeanell Sparrow, DO 09/06/21 2105

## 2021-09-06 NOTE — ED Triage Notes (Addendum)
Approx 3 weeks ago received flu shot, since sore throat, swollen gland and OTC meds since, Throat red. States she has a feeling of mucus in area that will not come out.

## 2021-09-09 ENCOUNTER — Ambulatory Visit: Payer: Self-pay | Admitting: Family Medicine

## 2021-10-25 ENCOUNTER — Encounter (HOSPITAL_COMMUNITY): Payer: Self-pay

## 2021-10-25 ENCOUNTER — Emergency Department (HOSPITAL_BASED_OUTPATIENT_CLINIC_OR_DEPARTMENT_OTHER): Payer: Managed Care, Other (non HMO)

## 2021-10-25 ENCOUNTER — Emergency Department (HOSPITAL_BASED_OUTPATIENT_CLINIC_OR_DEPARTMENT_OTHER)
Admission: EM | Admit: 2021-10-25 | Discharge: 2021-10-26 | Disposition: A | Discharge status: Home / Self Care | Payer: Managed Care, Other (non HMO) | Attending: Emergency Medicine | Admitting: Emergency Medicine

## 2021-10-25 ENCOUNTER — Telehealth (HOSPITAL_COMMUNITY): Payer: Self-pay | Admitting: Emergency Medicine

## 2021-10-25 ENCOUNTER — Other Ambulatory Visit: Payer: Self-pay

## 2021-10-25 ENCOUNTER — Ambulatory Visit (INDEPENDENT_AMBULATORY_CARE_PROVIDER_SITE_OTHER)
Admission: EM | Admit: 2021-10-25 | Discharge: 2021-10-25 | Disposition: A | Discharge status: Home / Self Care | Payer: Managed Care, Other (non HMO) | Source: Home / Self Care

## 2021-10-25 DIAGNOSIS — D509 Iron deficiency anemia, unspecified: Secondary | ICD-10-CM | POA: Insufficient documentation

## 2021-10-25 DIAGNOSIS — R109 Unspecified abdominal pain: Secondary | ICD-10-CM

## 2021-10-25 DIAGNOSIS — I10 Essential (primary) hypertension: Secondary | ICD-10-CM

## 2021-10-25 DIAGNOSIS — R1084 Generalized abdominal pain: Secondary | ICD-10-CM

## 2021-10-25 DIAGNOSIS — K5909 Other constipation: Secondary | ICD-10-CM | POA: Diagnosis not present

## 2021-10-25 DIAGNOSIS — R195 Other fecal abnormalities: Secondary | ICD-10-CM | POA: Insufficient documentation

## 2021-10-25 DIAGNOSIS — R1030 Lower abdominal pain, unspecified: Secondary | ICD-10-CM | POA: Insufficient documentation

## 2021-10-25 DIAGNOSIS — R509 Fever, unspecified: Secondary | ICD-10-CM | POA: Diagnosis present

## 2021-10-25 DIAGNOSIS — U071 COVID-19: Secondary | ICD-10-CM | POA: Diagnosis not present

## 2021-10-25 DIAGNOSIS — D259 Leiomyoma of uterus, unspecified: Secondary | ICD-10-CM

## 2021-10-25 DIAGNOSIS — E876 Hypokalemia: Secondary | ICD-10-CM

## 2021-10-25 DIAGNOSIS — N281 Cyst of kidney, acquired: Secondary | ICD-10-CM

## 2021-10-25 LAB — COMPREHENSIVE METABOLIC PANEL
ALT: 14 U/L (ref 0–44)
ALT: 10 U/L (ref 0–44)
AST: 16 U/L (ref 15–41)
AST: 12 U/L — ABNORMAL LOW (ref 15–41)
Albumin: 3.1 g/dL — ABNORMAL LOW (ref 3.5–5.0)
Albumin: 4 g/dL (ref 3.5–5.0)
Alkaline Phosphatase: 65 U/L (ref 38–126)
Alkaline Phosphatase: 64 U/L (ref 38–126)
Anion gap: 9 (ref 5–15)
Anion gap: 12 (ref 5–15)
BUN: 5 mg/dL — ABNORMAL LOW (ref 6–20)
BUN: 7 mg/dL (ref 6–20)
CO2: 26 mmol/L (ref 22–32)
CO2: 27 mmol/L (ref 22–32)
Calcium: 8.5 mg/dL — ABNORMAL LOW (ref 8.9–10.3)
Calcium: 9.2 mg/dL (ref 8.9–10.3)
Chloride: 101 mmol/L (ref 98–111)
Chloride: 99 mmol/L (ref 98–111)
Creatinine, Ser: 0.76 mg/dL (ref 0.44–1.00)
Creatinine, Ser: 0.8 mg/dL (ref 0.44–1.00)
GFR, Estimated: >60 mL/min (ref >60)
GFR, Estimated: >60 mL/min (ref >60)
Glucose, Bld: 103 mg/dL — ABNORMAL HIGH (ref 70–99)
Glucose, Bld: 101 mg/dL — ABNORMAL HIGH (ref 70–99)
Potassium: 2.7 mmol/L — CL (ref 3.5–5.1)
Potassium: 2.5 mmol/L — CL (ref 3.5–5.1)
Sodium: 136 mmol/L (ref 135–145)
Sodium: 138 mmol/L (ref 135–145)
Total Bilirubin: 0.3 mg/dL (ref 0.3–1.2)
Total Bilirubin: 0.4 mg/dL (ref 0.3–1.2)
Total Protein: 7.7 g/dL (ref 6.5–8.1)
Total Protein: 8.5 g/dL — ABNORMAL HIGH (ref 6.5–8.1)

## 2021-10-25 LAB — CBC WITH DIFFERENTIAL/PLATELET
Abs Immature Granulocytes: 0.08 10*3/uL — ABNORMAL HIGH (ref 0.00–0.07)
Abs Immature Granulocytes: 0.06 10*3/uL (ref 0.00–0.07)
Basophils Absolute: 0.1 10*3/uL (ref 0.0–0.1)
Basophils Absolute: 0.1 10*3/uL (ref 0.0–0.1)
Basophils Relative: 0 %
Basophils Relative: 1 %
Eosinophils Absolute: 0.1 10*3/uL (ref 0.0–0.5)
Eosinophils Absolute: 0.1 10*3/uL (ref 0.0–0.5)
Eosinophils Relative: 0 %
Eosinophils Relative: 0 %
HCT: 24.5 % — ABNORMAL LOW (ref 36.0–46.0)
HCT: 26.2 % — ABNORMAL LOW (ref 36.0–46.0)
Hemoglobin: 7.3 g/dL — ABNORMAL LOW (ref 12.0–15.0)
Hemoglobin: 7.5 g/dL — ABNORMAL LOW (ref 12.0–15.0)
Immature Granulocytes: 1 %
Immature Granulocytes: 0 %
Lymphocytes Relative: 15 %
Lymphocytes Relative: 11 %
Lymphs Abs: 2.4 10*3/uL (ref 0.7–4.0)
Lymphs Abs: 2 10*3/uL (ref 0.7–4.0)
MCH: 19.9 pg — ABNORMAL LOW (ref 26.0–34.0)
MCH: 19.1 pg — ABNORMAL LOW (ref 26.0–34.0)
MCHC: 29.8 g/dL — ABNORMAL LOW (ref 30.0–36.0)
MCHC: 28.6 g/dL — ABNORMAL LOW (ref 30.0–36.0)
MCV: 66.9 fL — ABNORMAL LOW (ref 80.0–100.0)
MCV: 66.8 fL — ABNORMAL LOW (ref 80.0–100.0)
Monocytes Absolute: 1.4 10*3/uL — ABNORMAL HIGH (ref 0.1–1.0)
Monocytes Absolute: 1.5 10*3/uL — ABNORMAL HIGH (ref 0.1–1.0)
Monocytes Relative: 9 %
Monocytes Relative: 8 %
Neutro Abs: 12.4 10*3/uL — ABNORMAL HIGH (ref 1.7–7.7)
Neutro Abs: 14 10*3/uL — ABNORMAL HIGH (ref 1.7–7.7)
Neutrophils Relative %: 75 %
Neutrophils Relative %: 80 %
Platelets: 384 10*3/uL (ref 150–400)
Platelets: 435 10*3/uL — ABNORMAL HIGH (ref 150–400)
RBC: 3.66 MIL/uL — ABNORMAL LOW (ref 3.87–5.11)
RBC: 3.92 MIL/uL (ref 3.87–5.11)
RDW: 22.3 % — ABNORMAL HIGH (ref 11.5–15.5)
RDW: 22.9 % — ABNORMAL HIGH (ref 11.5–15.5)
WBC: 16.4 10*3/uL — ABNORMAL HIGH (ref 4.0–10.5)
WBC: 17.7 10*3/uL — ABNORMAL HIGH (ref 4.0–10.5)
nRBC: 0.2 % (ref 0.0–0.2)
nRBC: 0.1 % (ref 0.0–0.2)

## 2021-10-25 LAB — POCT URINALYSIS DIPSTICK, ED / UC
Bilirubin Urine: NEGATIVE
Glucose, UA: NEGATIVE mg/dL
Hgb urine dipstick: NEGATIVE
Ketones, ur: NEGATIVE mg/dL
Leukocytes,Ua: NEGATIVE
Nitrite: NEGATIVE
Protein, ur: NEGATIVE mg/dL
Specific Gravity, Urine: 1.01 (ref 1.005–1.030)
Urobilinogen, UA: 0.2 mg/dL (ref 0.0–1.0)
pH: 6 (ref 5.0–8.0)

## 2021-10-25 LAB — POC URINE PREG, ED: Preg Test, Ur: NEGATIVE

## 2021-10-25 LAB — MAGNESIUM: Magnesium: 1.9 mg/dL (ref 1.7–2.4)

## 2021-10-25 LAB — RESP PANEL BY RT-PCR (FLU A&B, COVID) ARPGX2
Influenza A by PCR: NEGATIVE
Influenza B by PCR: NEGATIVE
SARS Coronavirus 2 by RT PCR: POSITIVE — AB

## 2021-10-25 LAB — OCCULT BLOOD X 1 CARD TO LAB, STOOL: Fecal Occult Bld: POSITIVE — AB

## 2021-10-25 MED ORDER — POTASSIUM CHLORIDE 10 MEQ/100ML IV SOLN
10.0000 meq | INTRAVENOUS | Status: AC
  Administered 2021-10-25 – 2021-10-26 (×3): 10 meq via INTRAVENOUS
  Filled 2021-10-25 (×3): qty 100

## 2021-10-25 MED ORDER — AMLODIPINE BESYLATE 10 MG PO TABS: 10.0000 mg | ORAL_TABLET | Freq: Every day | ORAL | 0 refills | Status: DC

## 2021-10-25 MED ORDER — POTASSIUM CHLORIDE CRYS ER 20 MEQ PO TBCR
40.0000 meq | EXTENDED_RELEASE_TABLET | Freq: Once | ORAL | Status: AC
  Administered 2021-10-25: 23:00:00 40 meq via ORAL
  Filled 2021-10-25: qty 2

## 2021-10-25 MED ORDER — DICYCLOMINE HCL 20 MG PO TABS: 20.0000 mg | ORAL_TABLET | Freq: Two times a day (BID) | ORAL | 0 refills | Status: DC

## 2021-10-25 MED ORDER — IOHEXOL 300 MG/ML  SOLN
100.0000 mL | Freq: Once | INTRAMUSCULAR | Status: AC | PRN
  Administered 2021-10-25: 22:00:00 100 mL via INTRAVENOUS

## 2021-10-25 MED ORDER — POTASSIUM CHLORIDE CRYS ER 20 MEQ PO TBCR: EXTENDED_RELEASE_TABLET | ORAL | 0 refills | Status: DC

## 2021-10-25 MED ORDER — MOLNUPIRAVIR EUA 200MG CAPSULE: 4.0000 | ORAL_CAPSULE | Freq: Two times a day (BID) | ORAL | 0 refills | Status: AC

## 2021-10-25 NOTE — Discharge Instructions (Addendum)
It was our pleasure to provide your ER care today - we hope that you feel better.From today's lab tests, your covid test is positive - see attached info. Take molnupiravir as prescribed.   Your potassium level is also quite low - eat plenty of fruits and vegetables, take potassium supplement as prescribed, and follow up with primary care doctor in one week. You also have chronic, microcytic anemia with lower blood count than two years prior - take your iron therapy regularly, and follow up closely with primary care doctor. In addition, your stool is brown but tests positive for blood - follow up closely with primary care doctor and GI doctor in the next 1-2 weeks - call to arrange appointment.   Your CT scan was read as showing: Numerous renal cysts and additional subcentimeter hypodense renal lesions too  small to further characterize. 2. Markedly enlarged uterus which extends to the level of the umbilicus. There are multiple calcified and non calcified uterine masses, some with cystic change, most likely fibroid disease.  Discuss above CT findings with primary care doctor at follow up, and have them arrange appropriate follow up.   Return to ER if worse, new symptoms, worsening or severe abdominal pain, weak/fainting, increased trouble breathing, or other concern.

## 2021-10-25 NOTE — ED Triage Notes (Signed)
Pt c/o bladder pressure, lower abdominal cramping, and urinary frequency since Thursday.

## 2021-10-25 NOTE — Discharge Instructions (Addendum)
Your urine was normal.  We will send this off for culture and contact you when to start any antibiotics.  It is possible that constipation is contributing to your symptoms.  Start Bentyl twice daily to help pain.  Follow-up with GI specialist as soon as possible.  If you have any worsening symptoms including severe abdominal pain, nausea, vomiting, recurrent dark stools you need to go to the emergency room.  Your blood pressure is very elevated.  Start amlodipine 10 mg.  Avoid decongestants, NSAIDs including aspirin, ibuprofen/Advil, naproxen/Aleve.  Limit caffeine and sodium.  If you develop any chest pain, shortness of breath, headache, vision changes in setting of high blood pressure you need to go to the emergency room.  We will try to establish you with your primary care provider but if you are unable to see them within a week or 2 please return to our clinic.

## 2021-10-25 NOTE — ED Provider Notes (Signed)
[facility]    CSN: [id number] Arrival date & time: 10/25/21  1513      History   Chief Complaint Chief Complaint  Patient presents with   Urinary Tract Infection    HPI Jenna Weaver is a 50 y.o. female.   Patient presents today with a several day history of intermittent abdominal pain.  Reports pain is rated 8 on a 0-10 pain scale, localized to lower abdomen but worse in right lower quadrant, described as cramping, no aggravating relieving factors identified.  She does report urinary frequency and urgency but denies any dysuria.  She reports history of constipation initially attributed symptoms to this condition.  She took Ex-Lax and had a bowel movement which provided some relief of symptoms but she experienced dark stools.  She denies any difficult to clean or sticky stools.  She has not been taking any Pepto-Bismol but does take iron supplement.  She denies history of GERD, peptic ulcer disease, gastritis.  She has been taking ibuprofen and NSAIDs without improvement of symptoms.  Denies any nausea, vomiting, fever.  Denies previous abdominal surgeries and still has gallbladder and appendix.  She denies urinary tract infection, recent urogenital procedure, self-catheterization, history of nephrolithiasis, single kidney, recent antibiotic use.  She reports difficulty with daily activities as result of symptoms.  Blood pressure is very elevated today.  She has history of elevated blood pressure readings for the past 3 months.  She denies formal diagnosis of hypertension and is not taking any antihypertensive medication.  She has been taking NSAIDs occasionally but has not been using decongestants and denies any increased consumption of caffeine or sodium.  Denies any chest pain, shortness of breath, headache, vision changes.   History reviewed. No pertinent past medical history.  Patient Active Problem List   Diagnosis Date Noted   Cervical radiculopathy 07/16/2021    BV (bacterial vaginosis) 03/23/2013   Cervicitis 03/23/2013    Past Surgical History:  Procedure Laterality Date   TUBAL LIGATION      OB History   No obstetric history on file.      Home Medications    Prior to Admission medications   Medication Sig Start Date End Date Taking? Authorizing Provider  amLODipine (NORVASC) 10 MG tablet Take 1 tablet (10 mg total) by mouth daily. 10/25/21  Yes Airis Barbee K, PA-C  dicyclomine (BENTYL) 20 MG tablet Take 1 tablet (20 mg total) by mouth 2 (two) times daily. 10/25/21  Yes Boyde Grieco, Derry Skill, PA-C  cetirizine (ZYRTEC ALLERGY) 10 MG tablet Take 1 tablet (10 mg total) by mouth daily for 14 days. 09/06/21 09/20/21  Wynona Dove A, DO  ferrous sulfate 325 (65 FE) MG tablet Take 1 tablet (325 mg total) by mouth daily. 07/24/18   Lawyer, Harrell Gave, PA-C  fluticasone (FLONASE) 50 MCG/ACT nasal spray Place 2 sprays into both nostrils daily. 09/06/21   Jeanell Sparrow, DO  gabapentin (NEURONTIN) 100 MG capsule Take 1 capsule (100 mg total) by mouth 3 (three) times daily. 07/16/21   Rosemarie Ax, MD  meloxicam (MOBIC) 7.5 MG tablet Take 1 tablet (7.5 mg total) by mouth 2 (two) times daily as needed. 07/16/21   Rosemarie Ax, MD  menthol-cetylpyridinium (CEPACOL REGULAR STRENGTH) 3 MG lozenge Take 1 lozenge (3 mg total) by mouth as needed for sore throat. 09/06/21   Jeanell Sparrow, DO  polyethylene glycol (MIRALAX) 17 g packet Take 17 g by mouth daily. 11/17/20   Jaynee Eagles, PA-C  omeprazole (  PRILOSEC) 20 MG capsule Take 1 capsule (20 mg total) by mouth daily. Patient not taking: No sig reported 05/16/19 11/17/20  Augusto Gamble B, NP  pantoprazole (PROTONIX) 40 MG tablet Take 1 tablet (40 mg total) by mouth daily. 08/06/19 11/17/20  Daleen Bo, MD  ranitidine (ZANTAC) 150 MG tablet Take 1 tablet (150 mg total) by mouth 2 (two) times daily. Patient not taking: Reported on 07/13/2015 07/11/13 05/16/19  Ignacia Felling, PA-C    Family History Family  History  Problem Relation Age of Onset   Hypertension Mother    Diabetes Mother    Healthy Father     Social History Social History   Tobacco Use   Smoking status: Never   Smokeless tobacco: Never  Vaping Use   Vaping Use: Never used  Substance Use Topics   Alcohol use: No   Drug use: No     Allergies   Codeine   Review of Systems Review of Systems  Constitutional:  Positive for activity change and fatigue. Negative for appetite change and fever.  Respiratory:  Negative for cough and shortness of breath.   Cardiovascular:  Negative for chest pain.  Gastrointestinal:  Positive for abdominal pain and constipation. Negative for diarrhea, nausea and vomiting.  Genitourinary:  Positive for frequency and urgency. Negative for dysuria, pelvic pain, vaginal bleeding, vaginal discharge and vaginal pain.  Neurological:  Negative for dizziness, light-headedness and headaches.    Physical Exam Triage Vital Signs ED Triage Vitals  Enc Vitals Group     BP 10/25/21 1641 (!) 171/84     Pulse Rate 10/25/21 1641 (!) 101     Resp 10/25/21 1641 18     Temp 10/25/21 1641 99.6 F (37.6 C)     Temp Source 10/25/21 1641 Oral     SpO2 10/25/21 1641 97 %     Weight --      Height --      Head Circumference --      Peak Flow --      Pain Score 10/25/21 1642 10     Pain Loc --      Pain Edu? --      Excl. in [employer]? --    No data found.  Updated Vital Signs BP (!) 171/84 (BP Location: Left Arm)    Pulse (!) 101    Temp 99.6 F (37.6 C) (Oral)    Resp 18    SpO2 97%   Visual Acuity Right Eye Distance:   Left Eye Distance:   Bilateral Distance:    Right Eye Near:   Left Eye Near:    Bilateral Near:     Physical Exam Vitals reviewed.  Constitutional:      General: She is awake. She is not in acute distress.    Appearance: Normal appearance. She is well-developed. She is not ill-appearing.     Comments: Very pleasant female appears stated age in no acute distress sitting  comfortably in exam room  HENT:     Head: Normocephalic and atraumatic.  Cardiovascular:     Rate and Rhythm: Normal rate and regular rhythm.     Heart sounds: Normal heart sounds, S1 normal and S2 normal. No murmur heard. Pulmonary:     Effort: Pulmonary effort is normal.     Breath sounds: Normal breath sounds. No wheezing, rhonchi or rales.     Comments: Clear to auscultation bilaterally Abdominal:     General: Bowel sounds are normal.     Palpations: Abdomen  is soft.     Tenderness: There is abdominal tenderness in the right lower quadrant and suprapubic area. There is no right CVA tenderness, left CVA tenderness, guarding or rebound. Negative signs include Rovsing's sign, McBurney's sign, psoas sign and obturator sign.     Comments: Mild tenderness to palpation in right lower quadrant and suprapubic region.  No evidence of acute abdomen on physical exam.  Musculoskeletal:     Cervical back: No tenderness or bony tenderness.     Thoracic back: No tenderness or bony tenderness.     Lumbar back: No tenderness or bony tenderness.  Psychiatric:        Behavior: Behavior is cooperative.     UC Treatments / Results  Labs (all labs ordered are listed, but only abnormal results are displayed) Labs Reviewed  URINE CULTURE  COMPREHENSIVE METABOLIC PANEL  CBC WITH DIFFERENTIAL/PLATELET  POCT URINALYSIS DIPSTICK, ED / UC  POC URINE PREG, ED    EKG   Radiology No results found.  Procedures Procedures (including critical care time)  Medications Ordered in UC Medications - No data to display  Initial Impression / Assessment and Plan / UC Course  I have reviewed the triage vital signs and the nursing notes.  Pertinent labs & imaging results that were available during my care of the patient were reviewed by me and considered in my medical decision making (see chart for details).     Blood pressure is very elevated today and has been elevated for several months.  Patient is  agreeable to starting antihypertensive medications we will start amlodipine 10 mg.  She was encouraged to avoid decongestants, NSAIDs, caffeine, sodium.  Recommended she monitor her blood pressure at home.  She does not have a PCP so we will try to establish her with 1 via PCP assistance.  Discussed that if she is unable to see them within 1 to 2 weeks she should return to our clinic for blood pressure recheck.  If she has any severe symptoms including chest pain, shortness of breath, headache, dizziness, vision changes in the setting of high blood pressure she needs to go to the emergency room.  Physical exam reassuring today; no indication for emergent evaluation or imaging.  Discussed that if she has persistent abdominal pain it would be reasonable to get a CT scan and we do not have imaging capabilities in urgent care so she would need to go to the emergency room.  Patient is not interested in going to the emergency room today but is agreeable if symptoms persist/worsen.  UA was normal.  Given associated urinary frequency and urgency we will send this for culture but defer antibiotics until results are obtained.  Discussed constipation could be contributing to symptoms and encouraged her to continue bowel regimen as previously prescribed.  She was given dicyclomine to help with abdominal cramping and pain.  Discussed that dark stool could indicate stomach bleeding particularly since she is not taking her iron regularly.  CBC and CMP were obtained today and if she has an increase in anemia she will need to go to the ER.  Discussed that given severity of symptoms I think it is reasonable to follow-up with a GI specialist and she was given contact information for local provider with instruction to call to schedule an appointment as soon as possible.  Discussed that if she has any worsening symptoms including increased pain, fever, nausea/vomiting interfering with oral intake, dark or bloody stools she needs to go  to the  ER.  Strict return precautions given to which she expressed understanding.  Final Clinical Impressions(s) / UC Diagnoses   Final diagnoses:  Lower abdominal pain  Abdominal cramping  Chronic constipation  Elevated blood pressure reading in office with diagnosis of hypertension  Dark stools     Discharge Instructions      Your urine was normal.  We will send this off for culture and contact you when to start any antibiotics.  It is possible that constipation is contributing to your symptoms.  Start Bentyl twice daily to help pain.  Follow-up with GI specialist as soon as possible.  If you have any worsening symptoms including severe abdominal pain, nausea, vomiting, recurrent dark stools you need to go to the emergency room.  Your blood pressure is very elevated.  Start amlodipine 10 mg.  Avoid decongestants, NSAIDs including aspirin, ibuprofen/Advil, naproxen/Aleve.  Limit caffeine and sodium.  If you develop any chest pain, shortness of breath, headache, vision changes in setting of high blood pressure you need to go to the emergency room.  We will try to establish you with your primary care provider but if you are unable to see them within a week or 2 please return to our clinic.     ED Prescriptions     Medication Sig Dispense Auth. Provider   amLODipine (NORVASC) 10 MG tablet Take 1 tablet (10 mg total) by mouth daily. 30 tablet Jasslyn Finkel K, PA-C   dicyclomine (BENTYL) 20 MG tablet Take 1 tablet (20 mg total) by mouth 2 (two) times daily. 20 tablet Rinoa Garramone, Derry Skill, PA-C      PDMP not reviewed this encounter.   Terrilee Croak, PA-C 10/25/21 1715

## 2021-10-25 NOTE — ED Triage Notes (Signed)
Patient reports lower abdominal pain. Patient reports she was seen today and told to come to the ER because her potassium was low. Patient reports her abdomen still hurts and she was concerned about her blood pressure

## 2021-10-25 NOTE — ED Provider Notes (Addendum)
[facility]ERGENC[facility] DEPT Provider Note   CSN: [id number] Arrival date & time: 10/25/21  1946     History Chief Complaint  Patient presents with   Abdominal Pain    Jenna Weaver is a 50 y.o. female.  Patient c/o lower abd pain in the past few days. Symptoms acute onset, moderate, dull, constant, non radiating. Is having regular stools, notes stools dark/dark brown. Takes iron occasionally but not regularly/consistently. No abd distension or vomiting. Fever in ED. No dysuria. No vaginal discharge or bleeding. At urgent care earlier today, preg neg, k low, instructed to come to ED. Denies back or flank pain. No hx same pain. Denies prior abd surgery. No cough or uri symptoms. No headache. No cp or sob. No extremity pain or swelling.   The history is provided by the patient and medical records.  Abdominal Pain Associated symptoms: fever   Associated symptoms: no chest pain, no cough, no diarrhea, no dysuria, no shortness of breath, no sore throat, no vaginal bleeding, no vaginal discharge and no vomiting       No past medical history on file.  Patient Active Problem List   Diagnosis Date Noted   Cervical radiculopathy 07/16/2021   BV (bacterial vaginosis) 03/23/2013   Cervicitis 03/23/2013    Past Surgical History:  Procedure Laterality Date   TUBAL LIGATION       OB History   No obstetric history on file.     Family History  Problem Relation Age of Onset   Hypertension Mother    Diabetes Mother    Healthy Father     Social History   Tobacco Use   Smoking status: Never   Smokeless tobacco: Never  Vaping Use   Vaping Use: Never used  Substance Use Topics   Alcohol use: No   Drug use: No    Home Medications Prior to Admission medications   Medication Sig Start Date End Date Taking? Authorizing Provider  amLODipine (NORVASC) 10 MG tablet Take 1 tablet (10 mg total) by mouth daily. 10/25/21   Raspet, Derry Skill, PA-C  cetirizine (ZYRTEC  ALLERGY) 10 MG tablet Take 1 tablet (10 mg total) by mouth daily for 14 days. 09/06/21 09/20/21  Jeanell Sparrow, DO  dicyclomine (BENTYL) 20 MG tablet Take 1 tablet (20 mg total) by mouth 2 (two) times daily. 10/25/21   Raspet, Derry Skill, PA-C  ferrous sulfate 325 (65 FE) MG tablet Take 1 tablet (325 mg total) by mouth daily. 07/24/18   Lawyer, Harrell Gave, PA-C  fluticasone (FLONASE) 50 MCG/ACT nasal spray Place 2 sprays into both nostrils daily. 09/06/21   Jeanell Sparrow, DO  gabapentin (NEURONTIN) 100 MG capsule Take 1 capsule (100 mg total) by mouth 3 (three) times daily. 07/16/21   Rosemarie Ax, MD  meloxicam (MOBIC) 7.5 MG tablet Take 1 tablet (7.5 mg total) by mouth 2 (two) times daily as needed. 07/16/21   Rosemarie Ax, MD  menthol-cetylpyridinium (CEPACOL REGULAR STRENGTH) 3 MG lozenge Take 1 lozenge (3 mg total) by mouth as needed for sore throat. 09/06/21   Jeanell Sparrow, DO  polyethylene glycol (MIRALAX) 17 g packet Take 17 g by mouth daily. 11/17/20   Jaynee Eagles, PA-C  omeprazole (PRILOSEC) 20 MG capsule Take 1 capsule (20 mg total) by mouth daily. Patient not taking: No sig reported 05/16/19 11/17/20  Augusto Gamble B, NP  pantoprazole (PROTONIX) 40 MG tablet Take 1 tablet (40 mg total) by mouth daily. 08/06/19 11/17/20  Eulis Foster,  Vira Agar, MD  ranitidine (ZANTAC) 150 MG tablet Take 1 tablet (150 mg total) by mouth 2 (two) times daily. Patient not taking: Reported on 07/13/2015 07/11/13 05/16/19  Ignacia Felling, PA-C    Allergies    Codeine  Review of Systems   Review of Systems  Constitutional:  Positive for fever.  HENT:  Negative for sore throat.   Eyes:  Negative for redness.  Respiratory:  Negative for cough and shortness of breath.   Cardiovascular:  Negative for chest pain.  Gastrointestinal:  Positive for abdominal pain. Negative for diarrhea and vomiting.  Genitourinary:  Negative for dysuria, flank pain, vaginal bleeding and vaginal discharge.  Musculoskeletal:  Negative  for back pain and neck pain.  Skin:  Negative for rash.  Neurological:  Negative for headaches.  Hematological:  Does not bruise/bleed easily.  Psychiatric/Behavioral:  Negative for confusion.    Physical Exam Updated Vital Signs BP 136/65    Pulse 94    Temp (!) 101.3 F (38.5 C)    Resp 17    SpO2 100%   Physical Exam Vitals and nursing note reviewed.  Constitutional:      Appearance: Normal appearance. She is well-developed.  HENT:     Head: Atraumatic.     Nose: Nose normal.     Mouth/Throat:     Mouth: Mucous membranes are moist.  Eyes:     General: No scleral icterus.    Conjunctiva/sclera: Conjunctivae normal.  Neck:     Trachea: No tracheal deviation.     Comments: No stiffness or rigidity.  Cardiovascular:     Rate and Rhythm: Normal rate and regular rhythm.     Pulses: Normal pulses.     Heart sounds: Normal heart sounds. No murmur heard.   No friction rub. No gallop.  Pulmonary:     Effort: Pulmonary effort is normal. No respiratory distress.     Breath sounds: Normal breath sounds.  Abdominal:     General: Bowel sounds are normal. There is no distension.     Palpations: Abdomen is soft. There is no mass.     Tenderness: There is abdominal tenderness. There is no guarding or rebound.     Hernia: No hernia is present.     Comments: RLQ tenderness.   Genitourinary:    Comments: No cva tenderness. Chaperoned rectal exam w rn - dark brown stool, no mass, no gross blood - sent for hemoccult.  Musculoskeletal:        General: No swelling.     Cervical back: Normal range of motion and neck supple. No rigidity. No muscular tenderness.  Lymphadenopathy:     Cervical: No cervical adenopathy.  Skin:    General: Skin is warm and dry.     Findings: No rash.  Neurological:     Mental Status: She is alert.     Comments: Alert, speech normal.   Psychiatric:        Mood and Affect: Mood normal.    ED Results / Procedures / Treatments   Labs (all labs ordered are  listed, but only abnormal results are displayed) Results for orders placed or performed during the hospital encounter of 10/25/21  Resp Panel by RT-PCR (Flu A&B, Covid) Nasopharyngeal Swab   Specimen: Nasopharyngeal Swab; Nasopharyngeal(NP) swabs in vial transport medium  Result Value Ref Range   SARS Coronavirus 2 by RT PCR POSITIVE (A) NEGATIVE   Influenza A by PCR NEGATIVE NEGATIVE   Influenza B by PCR NEGATIVE NEGATIVE  CBC  with Differential  Result Value Ref Range   WBC 17.7 (H) 4.0 - 10.5 K/uL   RBC 3.92 3.87 - 5.11 MIL/uL   Hemoglobin 7.5 (L) 12.0 - 15.0 g/dL   HCT 26.2 (L) 36.0 - 46.0 %   MCV 66.8 (L) 80.0 - 100.0 fL   MCH 19.1 (L) 26.0 - 34.0 pg   MCHC 28.6 (L) 30.0 - 36.0 g/dL   RDW 22.9 (H) 11.5 - 15.5 %   Platelets 435 (H) 150 - 400 K/uL   nRBC 0.1 0.0 - 0.2 %   Neutrophils Relative % 80 %   Neutro Abs 14.0 (H) 1.7 - 7.7 K/uL   Lymphocytes Relative 11 %   Lymphs Abs 2.0 0.7 - 4.0 K/uL   Monocytes Relative 8 %   Monocytes Absolute 1.5 (H) 0.1 - 1.0 K/uL   Eosinophils Relative 0 %   Eosinophils Absolute 0.1 0.0 - 0.5 K/uL   Basophils Relative 1 %   Basophils Absolute 0.1 0.0 - 0.1 K/uL   Immature Granulocytes 0 %   Abs Immature Granulocytes 0.06 0.00 - 0.07 K/uL  Comprehensive metabolic panel  Result Value Ref Range   Sodium 138 135 - 145 mmol/L   Potassium 2.5 (LL) 3.5 - 5.1 mmol/L   Chloride 99 98 - 111 mmol/L   CO2 27 22 - 32 mmol/L   Glucose, Bld 101 (H) 70 - 99 mg/dL   BUN 7 6 - 20 mg/dL   Creatinine, Ser 0.80 0.44 - 1.00 mg/dL   Calcium 9.2 8.9 - 10.3 mg/dL   Total Protein 8.5 (H) 6.5 - 8.1 g/dL   Albumin 4.0 3.5 - 5.0 g/dL   AST 12 (L) 15 - 41 U/L   ALT 10 0 - 44 U/L   Alkaline Phosphatase 64 38 - 126 U/L   Total Bilirubin 0.4 0.3 - 1.2 mg/dL   GFR, Estimated >60 >60 mL/min   Anion gap 12 5 - 15  Magnesium  Result Value Ref Range   Magnesium 1.9 1.7 - 2.4 mg/dL  Occult blood card to lab, stool  Result Value Ref Range   Fecal Occult Bld POSITIVE  (A) NEGATIVE      EKG EKG Interpretation  Date/Time:  Monday October 25 2021 21:52:52 EST Ventricular Rate:  95 PR Interval:  122 QRS Duration: 91 QT Interval:  347 QTC Calculation: 437 R Axis:   53 Text Interpretation: Sinus rhythm Low voltage, precordial leads Nonspecific T wave abnormality Confirmed by Lajean Saver [phone]) on 10/25/2021 10:55:58 PM  Radiology CT Abdomen Pelvis W Contrast  Result Date: 10/25/2021 CLINICAL DATA:  Bladder pressure and lower cramping urinary frequency EXAM: CT ABDOMEN AND PELVIS WITH CONTRAST TECHNIQUE: Multidetector CT imaging of the abdomen and pelvis was performed using the standard protocol following bolus administration of intravenous contrast. CONTRAST:  19mL OMNIPAQUE IOHEXOL 300 MG/ML  SOLN COMPARISON:  CT 11/14/2012, ultrasound abdomen 08/06/2019 FINDINGS: Lower chest: Lung bases demonstrate no acute consolidation or effusion. Normal cardiac size Hepatobiliary: Multiple hepatic cysts. No calcified gallstone or biliary dilatation. Additional subcentimeter hypodense liver lesions too small to further characterize Pancreas: Unremarkable. No pancreatic ductal dilatation or surrounding inflammatory changes. Spleen: Normal in size without focal abnormality. Adrenals/Urinary Tract: Adrenal glands are normal. Multiple renal cysts and subcentimeter hypodense renal lesions too small to further characterize. No hydronephrosis. The bladder is unremarkable Stomach/Bowel: Stomach is within normal limits. Appendix not well seen but no right lower quadrant inflammatory process. No evidence of bowel wall thickening, distention, or inflammatory changes. Vascular/Lymphatic:  Nonaneurysmal aorta.  No suspicious lymph nodes. Reproductive: Markedly enlarged uterus, extends to the level of the umbilicus. Calcified and noncalcified fibroids. Areas of presumed cystic degeneration. Other: Negative for pelvic effusion or free air. Musculoskeletal: No acute osseous abnormality.  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 IMPRESSION: 1. Negative for hydronephrosis or obstructing kidney stone. Numerous renal cysts and additional subcentimeter hypodense renal lesions too small to further characterize. 2. Markedly enlarged uterus which extends to the level of the umbilicus. There are multiple calcified and non calcified uterine masses, some with cystic change, most likely fibroid disease. Electronically Signed   By: Donavan Foil M.D.   On: 10/25/2021 22:31    Procedures Procedures   Medications Ordered in ED Medications  potassium chloride 10 mEq in 100 mL IVPB (has no administration in time range)    ED Course  I have reviewed the triage vital signs and the nursing notes.  Pertinent labs & imaging results that were available during my care of the patient were reviewed by me and considered in my medical decision making (see chart for details).    MDM Rules/Calculators/A&P                         Labs sent.  Reviewed nursing notes and prior charts for additional history.  UC visit reviewed.  Earlier today, UA neg for uti and upreg negative.   Labs reviewed/interpreted by me - k very low. Mg added to labs. Kcl iv and po. ECG. Continuous monitoring.   CT reviewed/interpreted by me - neg acute process, fibroids noted, renal cysts noted - rec pcp f/u.   Will give runs of iv k and po k for low k.    Pt febrile, covid is positive.   Pt has chronic microcytic anemia, and has been inconsistent with iron therapy. Stool is medium to dark brown, and does test heme positive. Rec continued iron therapy, and pcp/gi follow up.    Pt has had covid vaccine x 2/booster. Is breathing comfortably, currently room air sats are 99%.   Anticipate post k replacement, etc, pt will be able to be discharged to home with close outpatient f/u.   Return precautions provided.        Final Clinical Impression(s) / ED Diagnoses Final  diagnoses:  None    Rx / DC Orders ED Discharge Orders     None          Lajean Saver, MD 10/25/21 2330

## 2021-10-26 ENCOUNTER — Encounter: Payer: Self-pay | Admitting: Physician Assistant

## 2021-10-26 LAB — URINE CULTURE

## 2021-10-26 NOTE — ED Provider Notes (Signed)
Blood pressure 126/63, pulse 92, temperature 100.3 F (37.9 C), temperature source Oral, resp. rate (!) 31, SpO2 100 %.  Assuming care from Dr. Ashok Cordia.  In short, Jenna Weaver is a 50 y.o. female with a chief complaint of Abdominal Pain .  Refer to the original H&P for additional details.  The current plan of care is to follow up after IV K+.  02:15 AM  Patient has completed her potassium infusion.  Anemia as well as potassium supplementation and COVID-19 treatment addressed with close follow-up plan and outpatient treatment. Patient doing well and stable for d/c.     Margette Fast, MD 10/26/21 [phone]

## 2021-10-26 NOTE — ED Notes (Signed)
Pt verbalizes understanding of discharge instructions. Opportunity for questioning and answers were provided. Pt discharged from ED to home.   ? ?

## 2021-11-02 ENCOUNTER — Ambulatory Visit: Payer: Self-pay | Admitting: Nurse Practitioner

## 2021-11-10 ENCOUNTER — Ambulatory Visit: Payer: Self-pay | Admitting: Family

## 2021-11-11 ENCOUNTER — Ambulatory Visit: Payer: Self-pay | Admitting: Physician Assistant

## 2021-11-25 ENCOUNTER — Ambulatory Visit: Payer: Self-pay | Admitting: Family

## 2021-12-08 ENCOUNTER — Encounter: Payer: Self-pay | Admitting: Family

## 2021-12-12 NOTE — Progress Notes (Signed)
  This encounter was created in error - please disregard. No show 

## 2022-02-20 IMAGING — DX DG CHEST 1V PORT
1 series · 1 of 1 positions shown · non-contrast
Comparison: Chest radiograph dated 07/02/2019.

CLINICAL DATA: Cough.

EXAM:
PORTABLE CHEST 1 VIEW

[chest]
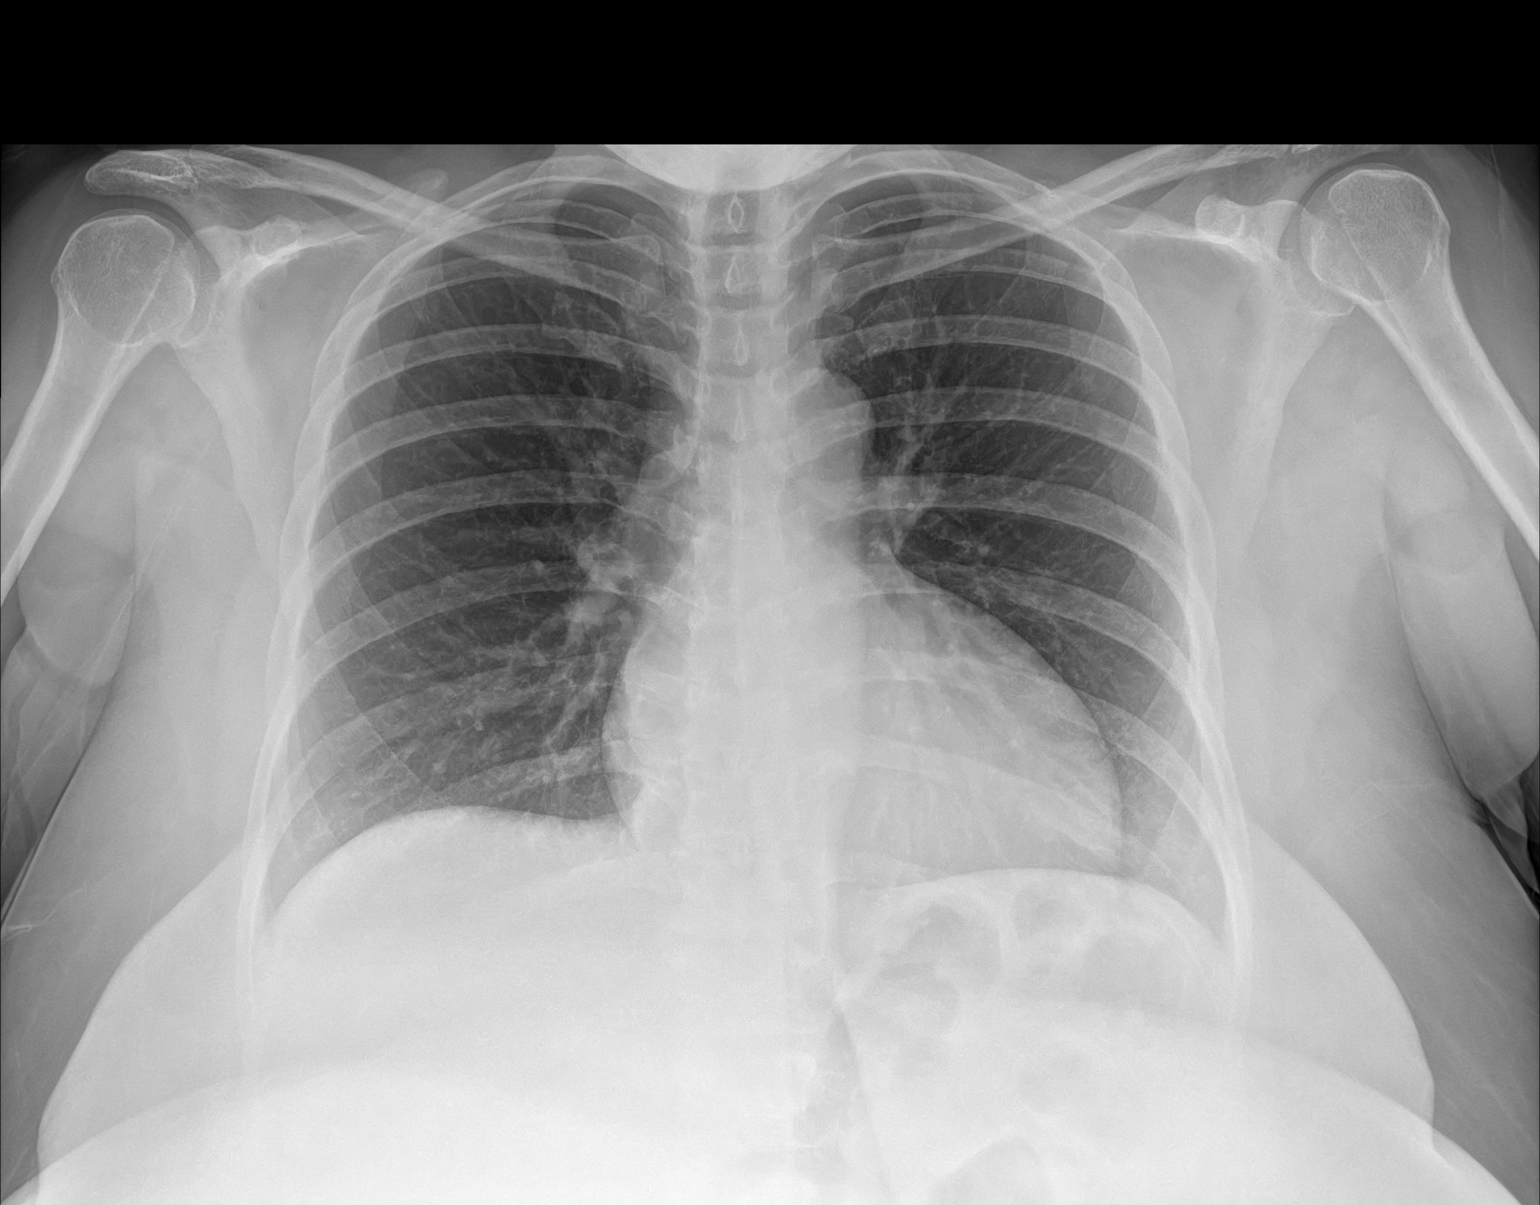

[1 of 1 positions shown; findings below may reference images not displayed]

FINDINGS: The heart size and mediastinal contours are within normal limits.
Both lungs are clear. The visualized skeletal structures are
unremarkable.
IMPRESSION: No active disease.

## 2022-08-31 ENCOUNTER — Other Ambulatory Visit: Payer: Self-pay | Admitting: Nurse Practitioner

## 2022-08-31 ENCOUNTER — Other Ambulatory Visit (HOSPITAL_COMMUNITY)
Admission: RE | Admit: 2022-08-31 | Discharge: 2022-08-31 | Disposition: A | Discharge status: Home / Self Care | Payer: BC Managed Care – PPO | Source: Ambulatory Visit | Attending: Nurse Practitioner | Admitting: Nurse Practitioner

## 2022-08-31 DIAGNOSIS — N760 Acute vaginitis: Secondary | ICD-10-CM | POA: Diagnosis not present

## 2022-08-31 DIAGNOSIS — Z01419 Encounter for gynecological examination (general) (routine) without abnormal findings: Secondary | ICD-10-CM | POA: Diagnosis not present

## 2022-08-31 DIAGNOSIS — Z124 Encounter for screening for malignant neoplasm of cervix: Secondary | ICD-10-CM | POA: Insufficient documentation

## 2022-09-01 ENCOUNTER — Other Ambulatory Visit: Payer: Self-pay | Admitting: Nurse Practitioner

## 2022-09-01 DIAGNOSIS — Z1231 Encounter for screening mammogram for malignant neoplasm of breast: Secondary | ICD-10-CM

## 2022-09-02 LAB — CYTOLOGY - PAP
Comment: NEGATIVE
Diagnosis: NEGATIVE
High risk HPV: NEGATIVE

## 2022-09-05 ENCOUNTER — Ambulatory Visit
Admission: RE | Admit: 2022-09-05 | Discharge: 2022-09-05 | Disposition: A | Discharge status: Home / Self Care | Payer: BC Managed Care – PPO | Source: Ambulatory Visit | Attending: Nurse Practitioner | Admitting: Nurse Practitioner

## 2022-09-05 DIAGNOSIS — Z1231 Encounter for screening mammogram for malignant neoplasm of breast: Secondary | ICD-10-CM | POA: Diagnosis not present

## 2022-09-05 DIAGNOSIS — N852 Hypertrophy of uterus: Secondary | ICD-10-CM | POA: Diagnosis not present

## 2022-09-05 DIAGNOSIS — D259 Leiomyoma of uterus, unspecified: Secondary | ICD-10-CM | POA: Diagnosis not present

## 2022-09-06 DIAGNOSIS — I10 Essential (primary) hypertension: Secondary | ICD-10-CM | POA: Diagnosis not present

## 2022-09-06 DIAGNOSIS — M25511 Pain in right shoulder: Secondary | ICD-10-CM | POA: Diagnosis not present

## 2022-09-06 DIAGNOSIS — Z0130 Encounter for examination of blood pressure without abnormal findings: Secondary | ICD-10-CM | POA: Diagnosis not present

## 2022-09-08 ENCOUNTER — Other Ambulatory Visit: Payer: Self-pay | Admitting: Nurse Practitioner

## 2022-09-08 DIAGNOSIS — R928 Other abnormal and inconclusive findings on diagnostic imaging of breast: Secondary | ICD-10-CM

## 2022-09-15 ENCOUNTER — Ambulatory Visit
Admission: RE | Admit: 2022-09-15 | Discharge: 2022-09-15 | Disposition: A | Discharge status: Home / Self Care | Payer: BC Managed Care – PPO | Source: Ambulatory Visit | Attending: Nurse Practitioner | Admitting: Nurse Practitioner

## 2022-09-15 DIAGNOSIS — R92341 Mammographic extreme density, right breast: Secondary | ICD-10-CM | POA: Diagnosis not present

## 2022-09-15 DIAGNOSIS — N6313 Unspecified lump in the right breast, lower outer quadrant: Secondary | ICD-10-CM | POA: Diagnosis not present

## 2022-09-15 DIAGNOSIS — R928 Other abnormal and inconclusive findings on diagnostic imaging of breast: Secondary | ICD-10-CM

## 2022-09-15 DIAGNOSIS — N6311 Unspecified lump in the right breast, upper outer quadrant: Secondary | ICD-10-CM | POA: Diagnosis not present

## 2022-10-06 DIAGNOSIS — Z0130 Encounter for examination of blood pressure without abnormal findings: Secondary | ICD-10-CM | POA: Diagnosis not present

## 2022-10-06 DIAGNOSIS — I10 Essential (primary) hypertension: Secondary | ICD-10-CM | POA: Diagnosis not present

## 2022-10-06 DIAGNOSIS — L282 Other prurigo: Secondary | ICD-10-CM | POA: Diagnosis not present

## 2022-10-18 DIAGNOSIS — I10 Essential (primary) hypertension: Secondary | ICD-10-CM | POA: Diagnosis not present

## 2022-10-18 DIAGNOSIS — L282 Other prurigo: Secondary | ICD-10-CM | POA: Diagnosis not present

## 2022-10-27 DIAGNOSIS — L282 Other prurigo: Secondary | ICD-10-CM | POA: Diagnosis not present

## 2022-10-27 DIAGNOSIS — I10 Essential (primary) hypertension: Secondary | ICD-10-CM | POA: Diagnosis not present

## 2022-10-27 DIAGNOSIS — J069 Acute upper respiratory infection, unspecified: Secondary | ICD-10-CM | POA: Diagnosis not present

## 2022-10-27 DIAGNOSIS — Z0130 Encounter for examination of blood pressure without abnormal findings: Secondary | ICD-10-CM | POA: Diagnosis not present

## 2022-11-03 DIAGNOSIS — I10 Essential (primary) hypertension: Secondary | ICD-10-CM | POA: Diagnosis not present

## 2022-11-03 DIAGNOSIS — Z862 Personal history of diseases of the blood and blood-forming organs and certain disorders involving the immune mechanism: Secondary | ICD-10-CM | POA: Diagnosis not present

## 2022-11-03 DIAGNOSIS — R202 Paresthesia of skin: Secondary | ICD-10-CM | POA: Diagnosis not present

## 2022-12-01 DIAGNOSIS — Z419 Encounter for procedure for purposes other than remedying health state, unspecified: Secondary | ICD-10-CM | POA: Diagnosis not present

## 2022-12-08 DIAGNOSIS — R609 Edema, unspecified: Secondary | ICD-10-CM | POA: Diagnosis not present

## 2022-12-08 DIAGNOSIS — I10 Essential (primary) hypertension: Secondary | ICD-10-CM | POA: Diagnosis not present

## 2022-12-30 DIAGNOSIS — Z419 Encounter for procedure for purposes other than remedying health state, unspecified: Secondary | ICD-10-CM | POA: Diagnosis not present

## 2023-01-30 DIAGNOSIS — Z419 Encounter for procedure for purposes other than remedying health state, unspecified: Secondary | ICD-10-CM | POA: Diagnosis not present

## 2023-02-13 ENCOUNTER — Encounter: Payer: Self-pay | Admitting: *Deleted

## 2023-02-28 DIAGNOSIS — R7303 Prediabetes: Secondary | ICD-10-CM | POA: Diagnosis not present

## 2023-02-28 DIAGNOSIS — D509 Iron deficiency anemia, unspecified: Secondary | ICD-10-CM | POA: Diagnosis not present

## 2023-02-28 DIAGNOSIS — M542 Cervicalgia: Secondary | ICD-10-CM | POA: Diagnosis not present

## 2023-02-28 DIAGNOSIS — I10 Essential (primary) hypertension: Secondary | ICD-10-CM | POA: Diagnosis not present

## 2023-03-01 DIAGNOSIS — Z419 Encounter for procedure for purposes other than remedying health state, unspecified: Secondary | ICD-10-CM | POA: Diagnosis not present

## 2023-03-17 DIAGNOSIS — R7612 Nonspecific reaction to cell mediated immunity measurement of gamma interferon antigen response without active tuberculosis: Secondary | ICD-10-CM | POA: Diagnosis not present

## 2023-04-01 DIAGNOSIS — Z419 Encounter for procedure for purposes other than remedying health state, unspecified: Secondary | ICD-10-CM | POA: Diagnosis not present

## 2023-05-01 DIAGNOSIS — Z419 Encounter for procedure for purposes other than remedying health state, unspecified: Secondary | ICD-10-CM | POA: Diagnosis not present

## 2023-06-01 DIAGNOSIS — Z419 Encounter for procedure for purposes other than remedying health state, unspecified: Secondary | ICD-10-CM | POA: Diagnosis not present

## 2023-06-13 ENCOUNTER — Encounter: Payer: Self-pay | Admitting: Nurse Practitioner

## 2023-06-13 ENCOUNTER — Ambulatory Visit (INDEPENDENT_AMBULATORY_CARE_PROVIDER_SITE_OTHER): Payer: BC Managed Care – PPO | Admitting: Nurse Practitioner

## 2023-06-13 VITALS — BP 132/98 | HR 80 | Temp 98.4°F | Ht 61.0 in | Wt 201.4 lb

## 2023-06-13 DIAGNOSIS — R7303 Prediabetes: Secondary | ICD-10-CM

## 2023-06-13 DIAGNOSIS — M542 Cervicalgia: Secondary | ICD-10-CM

## 2023-06-13 DIAGNOSIS — Z7689 Persons encountering health services in other specified circumstances: Secondary | ICD-10-CM | POA: Diagnosis not present

## 2023-06-13 DIAGNOSIS — Z8639 Personal history of other endocrine, nutritional and metabolic disease: Secondary | ICD-10-CM

## 2023-06-13 DIAGNOSIS — F4321 Adjustment disorder with depressed mood: Secondary | ICD-10-CM | POA: Diagnosis not present

## 2023-06-13 DIAGNOSIS — Z1211 Encounter for screening for malignant neoplasm of colon: Secondary | ICD-10-CM | POA: Diagnosis not present

## 2023-06-13 DIAGNOSIS — I10 Essential (primary) hypertension: Secondary | ICD-10-CM | POA: Diagnosis not present

## 2023-06-13 DIAGNOSIS — Z6838 Body mass index (BMI) 38.0-38.9, adult: Secondary | ICD-10-CM

## 2023-06-13 DIAGNOSIS — E6609 Other obesity due to excess calories: Secondary | ICD-10-CM

## 2023-06-13 DIAGNOSIS — M25511 Pain in right shoulder: Secondary | ICD-10-CM

## 2023-06-13 DIAGNOSIS — Z01419 Encounter for gynecological examination (general) (routine) without abnormal findings: Secondary | ICD-10-CM | POA: Diagnosis not present

## 2023-06-13 MED ORDER — PREDNISONE 20 MG PO TABS
20.0000 mg | ORAL_TABLET | Freq: Every day | ORAL | 0 refills | Status: DC
Start: 2023-06-13 — End: 2023-09-14

## 2023-06-13 MED ORDER — CYCLOBENZAPRINE HCL 10 MG PO TABS
10.0000 mg | ORAL_TABLET | Freq: Three times a day (TID) | ORAL | 0 refills | Status: AC | PRN
Start: 2023-06-13 — End: ?

## 2023-06-13 NOTE — Progress Notes (Unsigned)
Madelaine Bhat, CMA,acting as a Neurosurgeon for [name], FNP.,have documented all relevant documentation on the behalf of [name], FNP,as directed by  [name], FNP while in the presence of [name], FNP.  Subjective:  Patient ID: Jenna Weaver , female    DOB: 1971/02/08 , 52 y.o.   MRN: [id number]  Chief Complaint  Patient presents with   Establish Care    HPI  Patient presents today to establish care. 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 She has 5 children - Son 54, daughter 81, son 91, daughter 26 (lives in [place]) and son 35.   PMH - Hypertension - she had an allergic reaction to amlodipine. She is on losartan/hydrochlorothiazide and propranolol. Her blood pressure has been up recently due to there mother passed one month ago. Mother - had throat cancer. Mother had bleeding in the stomach after having a feeding tube placed. She was to have clamped the day of her surgery, was walking around. Then later that day her heart decreased and did CPR but they were unable to bring her mother back. She was given an envelope from someone on the floor but the death certificate said "upper bleed".   Mother - hypertension, diabetes, dialysis. Father - unknown. Maternal grandmother - hypertension and diabetes, maternal grandfather - hypertension and diabetes.   today patient would like to discuss muscle pain on the right side of her neck that radiates into her head. She has been using a heating pack. She has been using asper cream. She will feel like a "bobble head" sometimes with her pain to her neck. She told her previous provider about the pain. Was told to use a heat pack. She was not given any muscle relaxers. She did have an injury to her right shoulder while working about 8-9 years ago. Patient reports it has been happening for about 3 months. Patient reports she has a diagnosis of hypertension. Patient reports she has BV often she has not had for several months. She has  not had one lately.   Patient was previously being seen by [facility] department. She was seen 3 months ago. She just went back to work since her mother passed. She was not eating right and was not herself.  She did not go to the GI provider due to being scared.   Patient reports her mother passed away about a month ago. BP Readings from Last 3 Encounters: 06/13/23 : (!) 132/98 10/26/21 : 123/68 10/25/21 : Marland Kitchen 171/84       Past Medical History:  Diagnosis Date   Hypertension      Family History  Problem Relation Age of Onset   Hypertension Mother    Diabetes Mother    Healthy Father      Current Outpatient Medications:    cyclobenzaprine (FLEXERIL) 10 MG tablet, Take 1 tablet (10 mg total) by mouth 3 (three) times daily as needed., Disp: 20 tablet, Rfl: 0   losartan-hydrochlorothiazide (HYZAAR) 100-25 MG tablet, Take 1 tablet by mouth daily., Disp: , Rfl:    predniSONE (DELTASONE) 20 MG tablet, Take 1 tablet (20 mg total) by mouth daily with breakfast. Take 2 tabs by mouth daily x 3 days, Disp: 6 tablet, Rfl: 0   propranolol (INDERAL) 10 MG tablet, Take 10 mg by mouth 2 (two) times daily., Disp: , Rfl:    Allergies  Allergen Reactions   Amlodipine Hives and Swelling   Codeine Hives  Review of Systems  Constitutional: Negative.   HENT: Negative.    Eyes: Negative.   Respiratory: Negative.    Cardiovascular: Negative.   Gastrointestinal: Negative.   Neurological:  Negative for headaches.  Psychiatric/Behavioral: Negative.       Today's Vitals   06/13/23 1502  BP: (!) 132/98  Pulse: 80  Temp: 98.4 F (36.9 C)  TempSrc: Oral  Weight: 201 lb 6.4 oz (91.4 kg)  Height: 5\' 1"  (1.549 m)  PainSc: 10-Worst pain ever  PainLoc: Neck   Body mass index is 38.05 kg/m.  Wt Readings from Last 3 Encounters:  06/13/23 201 lb 6.4 oz (91.4 kg)  09/06/21 205 lb (93 kg)  07/16/21 230 lb (104.3 kg)    The ASCVD Risk score (Arnett DK, et al., 2019) failed to  calculate for the following reasons:   Cannot find a previous HDL lab   Cannot find a previous total cholesterol lab  Objective:  Physical Exam Vitals reviewed.  Constitutional:      General: She is not in acute distress.    Appearance: Normal appearance. She is well-developed. She is obese.  HENT:     Head: Normocephalic and atraumatic.  Eyes:     Pupils: Pupils are equal, round, and reactive to light.  Cardiovascular:     Rate and Rhythm: Normal rate and regular rhythm.     Pulses: Normal pulses.     Heart sounds: Normal heart sounds. No murmur heard. Pulmonary:     Effort: Pulmonary effort is normal. No respiratory distress.     Breath sounds: Normal breath sounds. No wheezing.  Musculoskeletal:        General: Normal range of motion.  Skin:    General: Skin is warm and dry.     Capillary Refill: Capillary refill takes less than 2 seconds.  Neurological:     General: No focal deficit present.     Mental Status: She is alert and oriented to person, place, and time.     Cranial Nerves: No cranial nerve deficit.     Motor: No weakness.  Psychiatric:        Mood and Affect: Mood normal.         Assessment And Plan:  Establishing care with new doctor, encounter for  Neck pain -     DG Shoulder Right; Future -     DG Cervical Spine Complete; Future -     predniSONE; Take 1 tablet (20 mg total) by mouth daily with breakfast. Take 2 tabs by mouth daily x 3 days  Dispense: 6 tablet; Refill: 0 -     Cyclobenzaprine HCl; Take 1 tablet (10 mg total) by mouth 3 (three) times daily as needed.  Dispense: 20 tablet; Refill: 0  Primary hypertension -     Basic metabolic panel  Screening for colon cancer -     Ambulatory referral to Gastroenterology  Class 2 obesity due to excess calories with body mass index (BMI) of 38.0 to 38.9 in adult, unspecified whether serious comorbidity present  Grief -     Ambulatory referral to Psychology  Encounter for gynecological  examination  Acute pain of right shoulder -     DG Shoulder Right; Future -     DG Cervical Spine Complete; Future -     predniSONE; Take 1 tablet (20 mg total) by mouth daily with breakfast. Take 2 tabs by mouth daily x 3 days  Dispense: 6 tablet; Refill: 0  Prediabetes -  Hemoglobin A1c  History of iron deficiency -     Iron, TIBC and Ferritin Panel    Return in about 3 months (around 09/13/2023) for phy when able.  Patient was given opportunity to ask questions. Patient verbalized understanding of the plan and was able to repeat key elements of the plan. All questions were answered to their satisfaction.    Jeanell Sparrow, FNP, have reviewed all documentation for this visit. The documentation on 06/13/23 for the exam, diagnosis, procedures, and orders are all accurate and complete.   IF YOU HAVE BEEN REFERRED TO A SPECIALIST, IT MAY TAKE 1-2 WEEKS TO SCHEDULE/PROCESS THE REFERRAL. IF YOU HAVE NOT HEARD FROM US/SPECIALIST IN TWO WEEKS, PLEASE GIVE [place] A CALL AT [phone].

## 2023-06-13 NOTE — Patient Instructions (Addendum)
You can go to [facility] for an xray you do not need an appt. Once we receive your results we will let you know and you may need an MRI

## 2023-06-14 DIAGNOSIS — Z1211 Encounter for screening for malignant neoplasm of colon: Secondary | ICD-10-CM | POA: Insufficient documentation

## 2023-06-14 DIAGNOSIS — Z01419 Encounter for gynecological examination (general) (routine) without abnormal findings: Secondary | ICD-10-CM | POA: Insufficient documentation

## 2023-06-14 DIAGNOSIS — M542 Cervicalgia: Secondary | ICD-10-CM | POA: Insufficient documentation

## 2023-06-14 DIAGNOSIS — F4321 Adjustment disorder with depressed mood: Secondary | ICD-10-CM | POA: Insufficient documentation

## 2023-06-14 DIAGNOSIS — E6609 Other obesity due to excess calories: Secondary | ICD-10-CM | POA: Insufficient documentation

## 2023-06-14 DIAGNOSIS — I10 Essential (primary) hypertension: Secondary | ICD-10-CM | POA: Insufficient documentation

## 2023-06-14 DIAGNOSIS — M25511 Pain in right shoulder: Secondary | ICD-10-CM | POA: Insufficient documentation

## 2023-06-14 DIAGNOSIS — Z8639 Personal history of other endocrine, nutritional and metabolic disease: Secondary | ICD-10-CM | POA: Insufficient documentation

## 2023-06-14 DIAGNOSIS — R7303 Prediabetes: Secondary | ICD-10-CM | POA: Insufficient documentation

## 2023-06-14 DIAGNOSIS — Z7689 Persons encountering health services in other specified circumstances: Secondary | ICD-10-CM | POA: Insufficient documentation

## 2023-06-14 NOTE — Assessment & Plan Note (Signed)
Will check Hgb A1c 

## 2023-06-14 NOTE — Assessment & Plan Note (Addendum)
Tenderness on palpation to right upper shoulder area. This is more like muscle tension. Will treat with short course of steroids and muscle relaxer as needed, advised to not take when driving or operating heavy machinery. She is to go for a neck xray at [facility]

## 2023-06-14 NOTE — Assessment & Plan Note (Signed)
Will check iron levels due to previous history

## 2023-06-14 NOTE — Assessment & Plan Note (Addendum)
Tenderness to anterior bursa, will treat with steroid short course. She is to go for a shoulder xray at [facility]

## 2023-06-14 NOTE — Assessment & Plan Note (Signed)
She is recently lost her mother in the last month will refer for grief counseling.

## 2023-06-14 NOTE — Assessment & Plan Note (Signed)
 According to USPTF Colorectal cancer Screening guidelines. Colonoscopy is recommended every 10 years, starting at age 50 years. Will refer to GI for colon cancer screening.

## 2023-06-14 NOTE — Assessment & Plan Note (Signed)
blood pressure slightly elevated however this is her first time visiting today.  Encouraged to focus on lifestyle modifications.  Continue current medications

## 2023-06-14 NOTE — Assessment & Plan Note (Signed)

## 2023-06-14 NOTE — Assessment & Plan Note (Signed)
She is encouraged to strive for BMI less than 30 to decrease cardiac risk. Advised to aim for at least 150 minutes of exercise per week.  

## 2023-07-02 DIAGNOSIS — Z419 Encounter for procedure for purposes other than remedying health state, unspecified: Secondary | ICD-10-CM | POA: Diagnosis not present

## 2023-07-17 ENCOUNTER — Telehealth: Payer: Self-pay | Admitting: Pharmacist

## 2023-07-17 NOTE — Progress Notes (Addendum)
07/17/2023  Patient ID: Jenna Weaver, female   DOB: 1971/04/01, 52 y.o.   MRN: [id number]  Unable to reach patient for [employer] BP metric due to elevated BP of 132/98 on 06/13/23. Left voicemail to return call at earliest convenience. Will try again in 1 week.   Patient returned call at 4:45PM. Advised she will check her BP tonight and tomorrow morning. Advised I call back tomorrow morning at 11AM after her orientation for her readings. If not able to reach me, will call back and LVM with numbers.  Unable to to reach at 11AM on 07/18/23. Left voicemail to return call at earliest convenience.   Marlowe Aschoff, PharmD [facility] Phone Number: [phone]

## 2023-07-18 ENCOUNTER — Other Ambulatory Visit: Payer: Self-pay | Admitting: Pharmacist

## 2023-07-18 NOTE — Progress Notes (Signed)
07/18/2023  Patient ID: Jenna Weaver, female   DOB: 09/30/1971, 52 y.o.   MRN: [id number]  Patient appearing on report for [employer] [facility] Metric - Hypertension Control report due to last documented ambulatory blood pressure of 132/98 on 06/13/23. Next appointment with PCP is 09/14/23.   Outreached patient to discuss hypertension control and medication management.   Current antihypertensives: Losartan-hydrochlorothiazide 100-125mg , Propranolol 10mg   Patient has an automated upper arm home BP machine.  Current blood pressure readings: 153/82 in AM, 130/80 in midday Reports it's unknown as to why diastolic readings are higher in the office at times.  Patient denies hypotensive signs and symptoms including dizziness, lightheadedness.  Patient denies hypertensive symptoms including headache, chest pain, shortness of breath.  Patient denies side effects related to medications.    Assessment/Plan: - Currently controlled - - Reviewed goal blood pressure <130/80  Marlowe Aschoff, PharmD [facility] Phone Number: [phone]

## 2023-07-25 ENCOUNTER — Ambulatory Visit (HOSPITAL_COMMUNITY): Payer: Medicaid Other | Admitting: Licensed Clinical Social Worker

## 2023-08-01 DIAGNOSIS — Z419 Encounter for procedure for purposes other than remedying health state, unspecified: Secondary | ICD-10-CM | POA: Diagnosis not present

## 2023-08-02 ENCOUNTER — Other Ambulatory Visit: Payer: Self-pay | Admitting: Nurse Practitioner

## 2023-08-02 DIAGNOSIS — Z1231 Encounter for screening mammogram for malignant neoplasm of breast: Secondary | ICD-10-CM

## 2023-08-04 ENCOUNTER — Other Ambulatory Visit: Payer: Self-pay | Admitting: Nurse Practitioner

## 2023-08-04 DIAGNOSIS — Z1211 Encounter for screening for malignant neoplasm of colon: Secondary | ICD-10-CM

## 2023-08-04 DIAGNOSIS — Z1212 Encounter for screening for malignant neoplasm of rectum: Secondary | ICD-10-CM

## 2023-09-14 ENCOUNTER — Encounter: Payer: Self-pay | Admitting: Nurse Practitioner

## 2023-09-14 ENCOUNTER — Ambulatory Visit: Payer: Medicaid Other | Admitting: Nurse Practitioner

## 2023-09-14 VITALS — BP 128/74 | HR 58 | Temp 97.7°F | Ht 61.0 in | Wt 214.2 lb

## 2023-09-14 DIAGNOSIS — N898 Other specified noninflammatory disorders of vagina: Secondary | ICD-10-CM | POA: Diagnosis not present

## 2023-09-14 DIAGNOSIS — Z1159 Encounter for screening for other viral diseases: Secondary | ICD-10-CM

## 2023-09-14 DIAGNOSIS — I10 Essential (primary) hypertension: Secondary | ICD-10-CM | POA: Diagnosis not present

## 2023-09-14 DIAGNOSIS — Z2821 Immunization not carried out because of patient refusal: Secondary | ICD-10-CM | POA: Insufficient documentation

## 2023-09-14 DIAGNOSIS — M542 Cervicalgia: Secondary | ICD-10-CM

## 2023-09-14 DIAGNOSIS — Z0000 Encounter for general adult medical examination without abnormal findings: Secondary | ICD-10-CM

## 2023-09-14 DIAGNOSIS — R7303 Prediabetes: Secondary | ICD-10-CM

## 2023-09-14 DIAGNOSIS — M79642 Pain in left hand: Secondary | ICD-10-CM

## 2023-09-14 DIAGNOSIS — Z79899 Other long term (current) drug therapy: Secondary | ICD-10-CM

## 2023-09-14 MED ORDER — METRONIDAZOLE 0.75 % EX GEL: 1.0000 | Freq: Two times a day (BID) | CUTANEOUS | 0 refills | Status: AC

## 2023-09-14 MED ORDER — PROPRANOLOL HCL 10 MG PO TABS: 10.0000 mg | ORAL_TABLET | Freq: Two times a day (BID) | ORAL | 2 refills | Status: AC

## 2023-09-14 NOTE — Patient Instructions (Addendum)
Go to [facility] at [address] for your xray you do not need an appt  Health Maintenance  Topic Date Due   Cologuard (Stool DNA test)  Never done   COVID-19 Vaccine (3 - Moderna risk series) 09/30/2023*   Zoster (Shingles) Vaccine (1 of 2) 12/15/2023*   Hepatitis C Screening  06/12/2024*   DTaP/Tdap/Td vaccine (1 - Tdap) 09/13/2024*   Mammogram  09/05/2024   Pap with HPV screening  09/01/2027   Flu Shot  Completed   HIV Screening  Completed   HPV Vaccine  Aged Out  *Topic was postponed. The date shown is not the original due date.

## 2023-09-14 NOTE — Progress Notes (Unsigned)
Jenna Weaver, CMA,acting as a Neurosurgeon for Jenna Felts, FNP.,have documented all relevant documentation on the behalf of Jenna Felts, FNP,as directed by  Jenna Felts, FNP while in the presence of Jenna Felts, FNP.  Subjective:    Patient ID: Jenna Weaver , female    DOB: [birth year] , 52 y.o.   MRN: [id number]  Chief Complaint  Patient presents with   Annual Exam    HPI  Patient presents today for HM, Patient reports compliance with medication. Patient denies any chest pain, SOB, or headaches. Patient reports she has been having vaginal discharge and vaginal odor- patient has history of BV. Patient also reports having hand pain that radiates up to her neck.      Past Medical History:  Diagnosis Date   Hypertension      Family History  Problem Relation Age of Onset   Hypertension Mother    Diabetes Mother    Healthy Father      Current Outpatient Medications:    cyclobenzaprine (FLEXERIL) 10 MG tablet, Take 1 tablet (10 mg total) by mouth 3 (three) times daily as needed., Disp: 20 tablet, Rfl: 0   losartan-hydrochlorothiazide (HYZAAR) 100-25 MG tablet, Take 1 tablet by mouth daily., Disp: , Rfl:    metroNIDAZOLE (METROGEL) 0.75 % gel, Apply 1 Application topically 2 (two) times daily., Disp: 45 g, Rfl: 0   metroNIDAZOLE (METROGEL) 0.75 % vaginal gel, Place 1 Applicatorful vaginally 2 (two) times daily., Disp: 70 g, Rfl: 0   propranolol (INDERAL) 10 MG tablet, Take 1 tablet (10 mg total) by mouth 2 (two) times daily., Disp: 90 tablet, Rfl: 2   Allergies  Allergen Reactions   Amlodipine Hives and Swelling   Codeine Hives     The patient states she uses post menopausal status for birth control. She does not think she has had a menstrual cycle since possibly July 2023. No LMP recorded (lmp unknown). (Menstrual status: Perimenopausal).  Negative for: breast discharge, breast lump(s), breast pain and breast self exam. Associated symptoms include abnormal vaginal bleeding.  Pertinent negatives include abnormal bleeding (hematology), anxiety, decreased libido, depression, difficulty falling sleep, dyspareunia, history of infertility, nocturia, sexual dysfunction, sleep disturbances, urinary incontinence, urinary urgency, vaginal discharge and vaginal itching. Diet regular; lately she has been craving for sweets and does not think her appetite has been great. She admits she needs to drink more water. She has cut back on her salt intake. She rinses her vegetables. The patient states her exercise level is minimal. She exercises by walking at work. She has a decreased appetite  The patient's tobacco use is:  Social History   Tobacco Use  Smoking Status Never  Smokeless Tobacco Never   has been exposed to passive smoke. The patient's alcohol use is:  Social History   Substance and Sexual Activity  Alcohol Use No   Additional information: Last pap 08/31/2022, next one scheduled for 09/01/2027.    Review of Systems  Constitutional: Negative.   HENT: Negative.    Eyes: Negative.   Respiratory: Negative.    Cardiovascular: Negative.   Gastrointestinal: Negative.   Endocrine: Negative.   Genitourinary: Negative.   Musculoskeletal: Negative.   Skin: Negative.   Allergic/Immunologic: Negative.   Neurological: Negative.   Hematological: Negative.   Psychiatric/Behavioral: Negative.       Today's Vitals   09/14/23 1415  BP: 128/74  Pulse: (!) 58  Temp: 97.7 F (36.5 C)  TempSrc: Oral  Weight: 214 lb 3.2 oz (97.2 kg)  Height: 5\' 1"  (1.549 m)  PainSc: 0-No pain   Body mass index is 40.47 kg/m.  Wt Readings from Last 3 Encounters:  09/14/23 214 lb 3.2 oz (97.2 kg)  06/13/23 201 lb 6.4 oz (91.4 kg)  09/06/21 205 lb (93 kg)     Objective:  Physical Exam Vitals reviewed.  Constitutional:      General: She is not in acute distress.    Appearance: Normal appearance. She is well-developed. She is obese.     Comments: morbid  HENT:     Head:  Normocephalic and atraumatic.     Right Ear: Hearing, tympanic membrane, ear canal and external ear normal. There is no impacted cerumen.     Left Ear: Hearing, tympanic membrane, ear canal and external ear normal. There is no impacted cerumen.     Nose: Nose normal.     Mouth/Throat:     Mouth: Mucous membranes are moist.  Eyes:     General: Lids are normal.     Extraocular Movements: Extraocular movements intact.     Conjunctiva/sclera: Conjunctivae normal.     Pupils: Pupils are equal, round, and reactive to light.     Funduscopic exam:    Right eye: No papilledema.        Left eye: No papilledema.  Neck:     Thyroid: No thyroid mass.     Vascular: No carotid bruit.  Cardiovascular:     Rate and Rhythm: Normal rate and regular rhythm.     Pulses: Normal pulses.     Heart sounds: Normal heart sounds. No murmur heard. Pulmonary:     Effort: Pulmonary effort is normal. No respiratory distress.     Breath sounds: Normal breath sounds. No wheezing.  Chest:     Chest wall: No mass.  Breasts:    Tanner Score is 5.     Right: Normal. No mass or tenderness.     Left: Normal. No mass or tenderness.  Abdominal:     General: Abdomen is flat. Bowel sounds are normal. There is no distension.     Palpations: Abdomen is soft.     Tenderness: There is no abdominal tenderness.  Genitourinary:    Rectum: Guaiac result negative.  Musculoskeletal:        General: No swelling. Normal range of motion.     Cervical back: Full passive range of motion without pain, normal range of motion and neck supple.     Right lower leg: No edema.     Left lower leg: No edema.  Lymphadenopathy:     Upper Body:     Right upper body: No supraclavicular, axillary or pectoral adenopathy.     Left upper body: No supraclavicular, axillary or pectoral adenopathy.  Skin:    General: Skin is warm and dry.     Capillary Refill: Capillary refill takes less than 2 seconds.  Neurological:     General: No focal  deficit present.     Mental Status: She is alert and oriented to person, place, and time.     Cranial Nerves: No cranial nerve deficit.     Sensory: No sensory deficit.  Psychiatric:        Mood and Affect: Mood normal.        Behavior: Behavior normal.        Thought Content: Thought content normal.        Judgment: Judgment normal.         Assessment And Plan:  Encounter for annual health examination Assessment & Plan: Behavior modifications discussed and diet history reviewed.   Pt will continue to exercise regularly and modify diet with low GI, plant based foods and decrease intake of processed foods.  Recommend intake of daily multivitamin, Vitamin D, and calcium.  Recommend mammogram and colonoscopy for preventive screenings, as well as recommend immunizations that include influenza, TDAP, and Shingles    Primary hypertension Assessment & Plan: blood pressure well-controlled.  Encouraged to focus on lifestyle modifications.  Continue current medications  Orders: -     EKG 12-Lead -     POCT URINALYSIS DIP (CLINITEK) -     Microalbumin / creatinine urine ratio -     CMP14+EGFR -     Lipid panel  Encounter for hepatitis C screening test for low risk patient Assessment & Plan: Will check Hepatitis C screening due to recent recommendations to screen all adults 18 years and older   Orders: -     Hepatitis C antibody  COVID-19 vaccination declined Assessment & Plan: Declines covid 19 vaccine. Discussed risk of covid 60 and if she changes her mind about the vaccine to call the office. Education has been provided regarding the importance of this vaccine but patient still declined. Advised may receive this vaccine at local pharmacy or Health Dept.or vaccine clinic. Aware to provide a copy of the vaccination record if obtained from local pharmacy or Health Dept.  Encouraged to take multivitamin, vitamin d, vitamin c and zinc to increase immune system. Aware can call office  if would like to have vaccine here at office. Verbalized acceptance and understanding.    Tetanus, diphtheria, and acellular pertussis (Tdap) vaccination declined  Herpes zoster vaccination declined Assessment & Plan: Declines shingrix, educated on disease process and is aware if he changes his mind to notify office    Prediabetes Assessment & Plan: Hemoglobin A1c is stable.  Will check HgbA1c.  Diet controlled.  Orders: -     Hemoglobin A1c  Vaginal discharge Assessment & Plan: She reports having a vaginal discharge will send sample off to check for bacterial vaginosis I will also go ahead and treat for yeast and bacterial vaginosis due to her personal history.   Vaginal odor -     NuSwab Vaginitis Plus (VG+)  Neck pain  Left hand pain Assessment & Plan: She has been having left hand pain will check x-ray  Orders: -     DG Hand Complete Left; Future  Other long term (current) drug therapy -     CBC with Differential/Platelet  Other orders -     Propranolol HCl; Take 1 tablet (10 mg total) by mouth 2 (two) times daily.  Dispense: 90 tablet; Refill: 2 -     metroNIDAZOLE; Apply 1 Application topically 2 (two) times daily.  Dispense: 45 g; Refill: 0     No follow-ups on file. Patient was given opportunity to ask questions. Patient verbalized understanding of the plan and was able to repeat key elements of the plan. All questions were answered to their satisfaction.   Jenna Felts, FNP  I, Jenna Felts, FNP, have reviewed all documentation for this visit. The documentation on 09/14/23 for the exam, diagnosis, procedures, and orders are all accurate and complete.

## 2023-09-15 LAB — LIPID PANEL
Chol/HDL Ratio: 3.2 ratio (ref 0.0–4.4)
Cholesterol, Total: 178 mg/dL (ref 100–199)
HDL: 55 mg/dL (ref >39)
LDL Chol Calc (NIH): 102 mg/dL — ABNORMAL HIGH (ref 0–99)
Triglycerides: 120 mg/dL (ref 0–149)
VLDL Cholesterol Cal: 21 mg/dL (ref 5–40)

## 2023-09-15 LAB — CBC WITH DIFFERENTIAL/PLATELET
Basophils Absolute: 0.1 10*3/uL (ref 0.0–0.2)
Basos: 1 %
EOS (ABSOLUTE): 0.2 10*3/uL (ref 0.0–0.4)
Eos: 3 %
Hematocrit: 43.1 % (ref 34.0–46.6)
Hemoglobin: 13 g/dL (ref 11.1–15.9)
Immature Grans (Abs): 0 10*3/uL (ref 0.0–0.1)
Immature Granulocytes: 0 %
Lymphocytes Absolute: 2.4 10*3/uL (ref 0.7–3.1)
Lymphs: 33 %
MCH: 24.6 pg — ABNORMAL LOW (ref 26.6–33.0)
MCHC: 30.2 g/dL — ABNORMAL LOW (ref 31.5–35.7)
MCV: 82 fL (ref 79–97)
Monocytes Absolute: 0.7 10*3/uL (ref 0.1–0.9)
Monocytes: 9 %
Neutrophils Absolute: 4 10*3/uL (ref 1.4–7.0)
Neutrophils: 54 %
Platelets: 266 10*3/uL (ref 150–450)
RBC: 5.28 x10E6/uL (ref 3.77–5.28)
RDW: 14.6 % (ref 11.7–15.4)
WBC: 7.3 10*3/uL (ref 3.4–10.8)

## 2023-09-15 LAB — CMP14+EGFR
ALT: 15 [IU]/L (ref 0–32)
AST: 24 [IU]/L (ref 0–40)
Albumin: 4.3 g/dL (ref 3.9–4.9)
Alkaline Phosphatase: 92 [IU]/L (ref 44–121)
BUN/Creatinine Ratio: 15 (ref 9–23)
BUN: 14 mg/dL (ref 6–24)
Bilirubin Total: <0.2 mg/dL (ref 0.0–1.2)
CO2: 25 mmol/L (ref 20–29)
Calcium: 9.4 mg/dL (ref 8.7–10.2)
Chloride: 99 mmol/L (ref 96–106)
Creatinine, Ser: 0.93 mg/dL (ref 0.57–1.00)
Globulin, Total: 3.4 g/dL (ref 1.5–4.5)
Glucose: 69 mg/dL — ABNORMAL LOW (ref 70–99)
Potassium: 3.9 mmol/L (ref 3.5–5.2)
Sodium: 140 mmol/L (ref 134–144)
Total Protein: 7.7 g/dL (ref 6.0–8.5)
eGFR: 75 mL/min/{1.73_m2} (ref >59)

## 2023-09-15 LAB — MICROALBUMIN / CREATININE URINE RATIO
Creatinine, Urine: 97.4 mg/dL
Microalb/Creat Ratio: 41 mg/g{creat} — ABNORMAL HIGH (ref 0–29)
Microalbumin, Urine: 40 ug/mL

## 2023-09-15 LAB — HEPATITIS C ANTIBODY: Hep C Virus Ab: NONREACTIVE

## 2023-09-15 LAB — HEMOGLOBIN A1C
Est. average glucose Bld gHb Est-mCnc: 131 mg/dL
Hgb A1c MFr Bld: 6.2 % — ABNORMAL HIGH (ref 4.8–5.6)

## 2023-09-17 LAB — NUSWAB VAGINITIS PLUS (VG+)
Atopobium vaginae: HIGH {score} — AB
BVAB 2: HIGH {score} — AB
Candida albicans, NAA: NEGATIVE
Candida glabrata, NAA: NEGATIVE
Chlamydia trachomatis, NAA: NEGATIVE
Megasphaera 1: HIGH {score} — AB
Neisseria gonorrhoeae, NAA: NEGATIVE
Trich vag by NAA: NEGATIVE

## 2023-09-18 ENCOUNTER — Ambulatory Visit
Admission: RE | Admit: 2023-09-18 | Discharge: 2023-09-18 | Disposition: A | Discharge status: Home / Self Care | Payer: Medicaid Other | Source: Ambulatory Visit | Attending: Nurse Practitioner | Admitting: Nurse Practitioner

## 2023-09-18 DIAGNOSIS — Z1231 Encounter for screening mammogram for malignant neoplasm of breast: Secondary | ICD-10-CM

## 2023-09-18 DIAGNOSIS — M79642 Pain in left hand: Secondary | ICD-10-CM

## 2023-09-18 DIAGNOSIS — M25511 Pain in right shoulder: Secondary | ICD-10-CM

## 2023-09-18 DIAGNOSIS — M542 Cervicalgia: Secondary | ICD-10-CM

## 2023-09-18 DIAGNOSIS — M5412 Radiculopathy, cervical region: Secondary | ICD-10-CM | POA: Diagnosis not present

## 2023-09-21 ENCOUNTER — Other Ambulatory Visit: Payer: Self-pay | Admitting: Nurse Practitioner

## 2023-09-21 ENCOUNTER — Other Ambulatory Visit: Payer: Self-pay

## 2023-09-21 DIAGNOSIS — N760 Acute vaginitis: Secondary | ICD-10-CM

## 2023-09-21 MED ORDER — METRONIDAZOLE 0.75 % VA GEL: 1.0000 | Freq: Two times a day (BID) | VAGINAL | 0 refills | Status: AC

## 2023-09-23 DIAGNOSIS — M79642 Pain in left hand: Secondary | ICD-10-CM | POA: Insufficient documentation

## 2023-09-23 DIAGNOSIS — Z2821 Immunization not carried out because of patient refusal: Secondary | ICD-10-CM | POA: Insufficient documentation

## 2023-09-23 DIAGNOSIS — Z1159 Encounter for screening for other viral diseases: Secondary | ICD-10-CM | POA: Insufficient documentation

## 2023-09-23 NOTE — Assessment & Plan Note (Signed)
Will check Hepatitis C screening due to recent recommendations to screen all adults 18 years and older

## 2023-09-23 NOTE — Assessment & Plan Note (Signed)
She reports having a vaginal discharge will send sample off to check for bacterial vaginosis I will also go ahead and treat for yeast and bacterial vaginosis due to her personal history.

## 2023-09-23 NOTE — Assessment & Plan Note (Signed)

## 2023-09-23 NOTE — Assessment & Plan Note (Signed)
Hemoglobin A1c is stable.  Will check HgbA1c.  Diet controlled.

## 2023-09-23 NOTE — Assessment & Plan Note (Signed)

## 2023-09-23 NOTE — Assessment & Plan Note (Signed)
She has been having left hand pain will check x-ray

## 2023-09-23 NOTE — Assessment & Plan Note (Signed)
Declines shingrix, educated on disease process and is aware if he changes his mind to notify office  

## 2023-09-23 NOTE — Assessment & Plan Note (Signed)
blood pressure well-controlled.  Encouraged to focus on lifestyle modifications.  Continue current medications

## 2023-09-26 LAB — POCT URINALYSIS DIP (CLINITEK)
Bilirubin, UA: NEGATIVE
Blood, UA: NEGATIVE
Glucose, UA: NEGATIVE mg/dL
Ketones, POC UA: NEGATIVE mg/dL
Leukocytes, UA: NEGATIVE
Nitrite, UA: NEGATIVE
POC PROTEIN,UA: NEGATIVE
Spec Grav, UA: >=1.03 — AB (ref 1.010–1.025)
Urobilinogen, UA: 0.2 U/dL
pH, UA: 6.5 (ref 5.0–8.0)

## 2024-08-28 ENCOUNTER — Encounter: Payer: Self-pay | Admitting: Nurse Practitioner

## 2024-08-28 DIAGNOSIS — Z1231 Encounter for screening mammogram for malignant neoplasm of breast: Secondary | ICD-10-CM

## 2024-09-16 ENCOUNTER — Encounter: Payer: Medicaid Other | Admitting: Nurse Practitioner

## 2024-09-16 ENCOUNTER — Telehealth: Payer: Self-pay | Admitting: Nurse Practitioner

## 2024-09-16 NOTE — Progress Notes (Deleted)
 Jenna Weaver, CMA,acting as a neurosurgeon for Jenna Ada, FNP.,have documented all relevant documentation on the behalf of Jenna Ada, FNP,as directed by  Jenna Ada, FNP while in the presence of Jenna Ada, FNP.  Subjective:    Patient ID: Jenna Weaver , female    DOB: 03/08/1971 , 53 y.o.   MRN: [id number]  No chief complaint on file.   HPI  HPI   Past Medical History:  Diagnosis Date   Hypertension      Family History  Problem Relation Age of Onset   Hypertension Mother    Diabetes Mother    Healthy Father      Current Outpatient Medications:    cyclobenzaprine  (FLEXERIL ) 10 MG tablet, Take 1 tablet (10 mg total) by mouth 3 (three) times daily as needed., Disp: 20 tablet, Rfl: 0   losartan-hydrochlorothiazide (HYZAAR) 100-25 MG tablet, Take 1 tablet by mouth daily., Disp: , Rfl:    metroNIDAZOLE  (METROGEL ) 0.75 % gel, Apply 1 Application topically 2 (two) times daily., Disp: 45 g, Rfl: 0   metroNIDAZOLE  (METROGEL ) 0.75 % vaginal gel, Place 1 Applicatorful vaginally 2 (two) times daily., Disp: 70 g, Rfl: 0   propranolol  (INDERAL ) 10 MG tablet, Take 1 tablet (10 mg total) by mouth 2 (two) times daily., Disp: 90 tablet, Rfl: 2   Allergies  Allergen Reactions   Amlodipine  Hives and Swelling   Codeine Hives      The patient states she uses {contraceptive methods:5051} for birth control. No LMP recorded. (Menstrual status: Perimenopausal).. {Dysmenorrhea-menorrhagia:21918}. Negative for: breast discharge, breast lump(s), breast pain and breast self exam. Associated symptoms include abnormal vaginal bleeding. Pertinent negatives include abnormal bleeding (hematology), anxiety, decreased libido, depression, difficulty falling sleep, dyspareunia, history of infertility, nocturia, sexual dysfunction, sleep disturbances, urinary incontinence, urinary urgency, vaginal discharge and vaginal itching. Diet regular.The patient states her exercise level is    . The patient's  tobacco use is:  Social History   Tobacco Use  Smoking Status Never  Smokeless Tobacco Never  . She has been exposed to passive smoke. The patient's alcohol use is:  Social History   Substance and Sexual Activity  Alcohol Use No  . Additional information: Last pap ***, next one scheduled for ***.    Review of Systems   There were no vitals filed for this visit. There is no height or weight on file to calculate BMI.  Wt Readings from Last 3 Encounters:  09/14/23 214 lb 3.2 oz (97.2 kg)  06/13/23 201 lb 6.4 oz (91.4 kg)  09/06/21 205 lb (93 kg)     Objective:  Physical Exam      Assessment And Plan:     Encounter for annual health examination  Primary hypertension  Prediabetes     No follow-ups on file. Patient was given opportunity to ask questions. Patient verbalized understanding of the plan and was able to repeat key elements of the plan. All questions were answered to their satisfaction.   Jenna Ada, FNP  I, Jenna Ada, FNP, have reviewed all documentation for this visit. The documentation on 09/16/24 for the exam, diagnosis, procedures, and orders are all accurate and complete.

## 2024-09-16 NOTE — Telephone Encounter (Signed)
 Pt came in wondering why she had an appointment. She said [facility] was not her provider anymore. I told her that the appointment was made from her last visit last year. She stated that she currently does not have any insurance.

## 2024-10-02 ENCOUNTER — Telehealth: Payer: Self-pay

## 2024-10-02 NOTE — Telephone Encounter (Addendum)
 Telephoned patient at mobile number. Left a voice message with [facility] (referral) scheduling contact information.  Telephoned patient and confirmed address. Mailed a mammogram scholarship application.
